# Patient Record
Sex: Female | Born: 1945 | Race: White | Hispanic: No | Marital: Married | Smoking: Never smoker
Health system: Southern US, Community
[De-identification: ages and names within clinical notes are randomized; demographics above are authoritative.]

## PROBLEM LIST (undated history)

## (undated) DIAGNOSIS — I82409 Acute embolism and thrombosis of unspecified deep veins of unspecified lower extremity: Secondary | ICD-10-CM

## (undated) DIAGNOSIS — K219 Gastro-esophageal reflux disease without esophagitis: Secondary | ICD-10-CM

## (undated) DIAGNOSIS — M545 Low back pain, unspecified: Secondary | ICD-10-CM

## (undated) DIAGNOSIS — M1990 Unspecified osteoarthritis, unspecified site: Secondary | ICD-10-CM

## (undated) DIAGNOSIS — R17 Unspecified jaundice: Secondary | ICD-10-CM

## (undated) DIAGNOSIS — R519 Headache, unspecified: Secondary | ICD-10-CM

## (undated) DIAGNOSIS — E785 Hyperlipidemia, unspecified: Secondary | ICD-10-CM

## (undated) DIAGNOSIS — I2510 Atherosclerotic heart disease of native coronary artery without angina pectoris: Secondary | ICD-10-CM

## (undated) DIAGNOSIS — K7460 Unspecified cirrhosis of liver: Secondary | ICD-10-CM

## (undated) DIAGNOSIS — G4730 Sleep apnea, unspecified: Secondary | ICD-10-CM

## (undated) DIAGNOSIS — N289 Disorder of kidney and ureter, unspecified: Secondary | ICD-10-CM

## (undated) DIAGNOSIS — K760 Fatty (change of) liver, not elsewhere classified: Secondary | ICD-10-CM

## (undated) DIAGNOSIS — I2089 Other forms of angina pectoris: Secondary | ICD-10-CM

## (undated) DIAGNOSIS — G8929 Other chronic pain: Secondary | ICD-10-CM

## (undated) DIAGNOSIS — D696 Thrombocytopenia, unspecified: Secondary | ICD-10-CM

## (undated) DIAGNOSIS — R51 Headache: Secondary | ICD-10-CM

## (undated) DIAGNOSIS — L97909 Non-pressure chronic ulcer of unspecified part of unspecified lower leg with unspecified severity: Secondary | ICD-10-CM

## (undated) DIAGNOSIS — M109 Gout, unspecified: Secondary | ICD-10-CM

## (undated) DIAGNOSIS — I10 Essential (primary) hypertension: Secondary | ICD-10-CM

## (undated) DIAGNOSIS — I4891 Unspecified atrial fibrillation: Secondary | ICD-10-CM

## (undated) DIAGNOSIS — E1142 Type 2 diabetes mellitus with diabetic polyneuropathy: Secondary | ICD-10-CM

## (undated) DIAGNOSIS — I208 Other forms of angina pectoris: Secondary | ICD-10-CM

## (undated) DIAGNOSIS — E119 Type 2 diabetes mellitus without complications: Secondary | ICD-10-CM

## (undated) DIAGNOSIS — N183 Chronic kidney disease, stage 3 (moderate): Secondary | ICD-10-CM

## (undated) DIAGNOSIS — I509 Heart failure, unspecified: Secondary | ICD-10-CM

## (undated) DIAGNOSIS — H409 Unspecified glaucoma: Secondary | ICD-10-CM

## (undated) DIAGNOSIS — Z9289 Personal history of other medical treatment: Secondary | ICD-10-CM

## (undated) DIAGNOSIS — M797 Fibromyalgia: Secondary | ICD-10-CM

## (undated) HISTORY — PX: DILATION AND CURETTAGE OF UTERUS: SHX78

## (undated) HISTORY — PX: EXCISIONAL HEMORRHOIDECTOMY: SHX1541

## (undated) HISTORY — PX: CORONARY ANGIOPLASTY WITH STENT PLACEMENT: SHX49

## (undated) HISTORY — PX: TONSILLECTOMY AND ADENOIDECTOMY: SUR1326

## (undated) HISTORY — PX: APPENDECTOMY: SHX54

## (undated) HISTORY — PX: CHOLECYSTECTOMY OPEN: SUR202

## (undated) HISTORY — DX: Essential (primary) hypertension: I10

## (undated) HISTORY — PX: GANGLION CYST EXCISION: SHX1691

## (undated) HISTORY — PX: ANAL FISSURE REPAIR: SHX2312

## (undated) HISTORY — PX: TOE AMPUTATION: SHX809

## (undated) HISTORY — PX: PILONIDAL CYST EXCISION: SHX744

## (undated) HISTORY — PX: BUNIONECTOMY WITH HAMMERTOE RECONSTRUCTION: SHX5600

## (undated) HISTORY — PX: WISDOM TOOTH EXTRACTION: SHX21

---

## 1978-03-22 HISTORY — PX: ABDOMINAL HYSTERECTOMY: SHX81

## 1978-11-21 HISTORY — PX: NECK LESION BIOPSY: SHX2078

## 1998-02-10 ENCOUNTER — Encounter: Payer: Self-pay | Admitting: Endocrinology

## 1998-02-10 ENCOUNTER — Ambulatory Visit (HOSPITAL_COMMUNITY): Admission: RE | Admit: 1998-02-10 | Discharge: 1998-02-10 | Payer: Self-pay | Admitting: Endocrinology

## 1998-12-05 ENCOUNTER — Observation Stay (HOSPITAL_COMMUNITY): Admission: RE | Admit: 1998-12-05 | Discharge: 1998-12-06 | Payer: Self-pay | Admitting: *Deleted

## 1999-01-12 ENCOUNTER — Encounter: Payer: Self-pay | Admitting: Emergency Medicine

## 1999-01-12 ENCOUNTER — Ambulatory Visit (HOSPITAL_COMMUNITY): Admission: RE | Admit: 1999-01-12 | Discharge: 1999-01-12 | Payer: Self-pay | Admitting: Emergency Medicine

## 1999-01-12 ENCOUNTER — Emergency Department (HOSPITAL_COMMUNITY): Admission: EM | Admit: 1999-01-12 | Discharge: 1999-01-12 | Payer: Self-pay | Admitting: Emergency Medicine

## 1999-05-04 ENCOUNTER — Ambulatory Visit (HOSPITAL_COMMUNITY): Admission: RE | Admit: 1999-05-04 | Discharge: 1999-05-04 | Payer: Self-pay | Admitting: Endocrinology

## 1999-05-04 ENCOUNTER — Encounter: Payer: Self-pay | Admitting: Endocrinology

## 2000-01-11 ENCOUNTER — Ambulatory Visit (HOSPITAL_COMMUNITY): Admission: RE | Admit: 2000-01-11 | Discharge: 2000-01-11 | Payer: Self-pay | Admitting: *Deleted

## 2000-01-11 ENCOUNTER — Encounter (INDEPENDENT_AMBULATORY_CARE_PROVIDER_SITE_OTHER): Payer: Self-pay

## 2000-01-14 ENCOUNTER — Ambulatory Visit (HOSPITAL_COMMUNITY): Admission: RE | Admit: 2000-01-14 | Discharge: 2000-01-14 | Payer: Self-pay | Admitting: *Deleted

## 2000-01-14 ENCOUNTER — Encounter: Payer: Self-pay | Admitting: *Deleted

## 2000-07-06 ENCOUNTER — Ambulatory Visit (HOSPITAL_COMMUNITY): Admission: RE | Admit: 2000-07-06 | Discharge: 2000-07-06 | Payer: Self-pay | Admitting: Endocrinology

## 2000-07-06 ENCOUNTER — Encounter: Payer: Self-pay | Admitting: Endocrinology

## 2000-07-14 ENCOUNTER — Other Ambulatory Visit: Admission: RE | Admit: 2000-07-14 | Discharge: 2000-07-14 | Payer: Self-pay | Admitting: Endocrinology

## 2001-02-01 ENCOUNTER — Encounter: Admission: RE | Admit: 2001-02-01 | Discharge: 2001-02-01 | Payer: Self-pay | Admitting: Endocrinology

## 2001-02-01 ENCOUNTER — Encounter: Payer: Self-pay | Admitting: Endocrinology

## 2001-06-08 ENCOUNTER — Encounter: Payer: Self-pay | Admitting: Endocrinology

## 2001-06-08 ENCOUNTER — Ambulatory Visit (HOSPITAL_COMMUNITY): Admission: RE | Admit: 2001-06-08 | Discharge: 2001-06-08 | Payer: Self-pay | Admitting: Endocrinology

## 2002-04-02 ENCOUNTER — Inpatient Hospital Stay (HOSPITAL_COMMUNITY): Admission: EM | Admit: 2002-04-02 | Discharge: 2002-04-06 | Payer: Self-pay | Admitting: Emergency Medicine

## 2002-04-02 ENCOUNTER — Encounter: Payer: Self-pay | Admitting: *Deleted

## 2002-04-04 ENCOUNTER — Encounter: Payer: Self-pay | Admitting: *Deleted

## 2002-06-05 ENCOUNTER — Ambulatory Visit (HOSPITAL_COMMUNITY): Admission: RE | Admit: 2002-06-05 | Discharge: 2002-06-05 | Payer: Self-pay | Admitting: Endocrinology

## 2002-06-05 ENCOUNTER — Encounter: Payer: Self-pay | Admitting: Endocrinology

## 2002-08-07 ENCOUNTER — Ambulatory Visit (HOSPITAL_COMMUNITY): Admission: RE | Admit: 2002-08-07 | Discharge: 2002-08-08 | Payer: Self-pay | Admitting: *Deleted

## 2002-08-07 ENCOUNTER — Encounter: Payer: Self-pay | Admitting: *Deleted

## 2002-10-03 ENCOUNTER — Ambulatory Visit (HOSPITAL_COMMUNITY): Admission: RE | Admit: 2002-10-03 | Discharge: 2002-10-03 | Payer: Self-pay | Admitting: *Deleted

## 2002-10-03 ENCOUNTER — Encounter (INDEPENDENT_AMBULATORY_CARE_PROVIDER_SITE_OTHER): Payer: Self-pay

## 2002-12-23 ENCOUNTER — Encounter: Payer: Self-pay | Admitting: *Deleted

## 2002-12-23 ENCOUNTER — Inpatient Hospital Stay (HOSPITAL_COMMUNITY): Admission: EM | Admit: 2002-12-23 | Discharge: 2002-12-26 | Payer: Self-pay | Admitting: Emergency Medicine

## 2003-03-20 ENCOUNTER — Inpatient Hospital Stay (HOSPITAL_COMMUNITY): Admission: EM | Admit: 2003-03-20 | Discharge: 2003-03-22 | Payer: Self-pay | Admitting: Emergency Medicine

## 2003-03-23 DIAGNOSIS — Z9289 Personal history of other medical treatment: Secondary | ICD-10-CM

## 2003-03-23 HISTORY — PX: CORONARY ARTERY BYPASS GRAFT: SHX141

## 2003-03-23 HISTORY — DX: Personal history of other medical treatment: Z92.89

## 2003-06-29 ENCOUNTER — Inpatient Hospital Stay (HOSPITAL_COMMUNITY): Admission: EM | Admit: 2003-06-29 | Discharge: 2003-07-15 | Payer: Self-pay | Admitting: Emergency Medicine

## 2003-08-05 ENCOUNTER — Encounter
Admission: RE | Admit: 2003-08-05 | Discharge: 2003-08-05 | Payer: Self-pay | Admitting: Thoracic Surgery (Cardiothoracic Vascular Surgery)

## 2006-03-17 ENCOUNTER — Encounter: Admission: RE | Admit: 2006-03-17 | Discharge: 2006-03-17 | Payer: Self-pay | Admitting: Endocrinology

## 2006-05-08 ENCOUNTER — Emergency Department (HOSPITAL_COMMUNITY): Admission: EM | Admit: 2006-05-08 | Discharge: 2006-05-08 | Payer: Self-pay | Admitting: Emergency Medicine

## 2006-06-02 ENCOUNTER — Encounter: Admission: RE | Admit: 2006-06-02 | Discharge: 2006-06-02 | Payer: Self-pay | Admitting: *Deleted

## 2006-06-08 ENCOUNTER — Ambulatory Visit (HOSPITAL_COMMUNITY): Admission: RE | Admit: 2006-06-08 | Discharge: 2006-06-09 | Payer: Self-pay | Admitting: *Deleted

## 2006-06-30 ENCOUNTER — Ambulatory Visit: Payer: Self-pay | Admitting: Vascular Surgery

## 2006-06-30 ENCOUNTER — Inpatient Hospital Stay (HOSPITAL_COMMUNITY): Admission: EM | Admit: 2006-06-30 | Discharge: 2006-07-04 | Payer: Self-pay | Admitting: Emergency Medicine

## 2006-07-01 ENCOUNTER — Encounter (INDEPENDENT_AMBULATORY_CARE_PROVIDER_SITE_OTHER): Payer: Self-pay | Admitting: Specialist

## 2006-07-28 ENCOUNTER — Ambulatory Visit: Payer: Self-pay | Admitting: *Deleted

## 2006-08-05 ENCOUNTER — Emergency Department (HOSPITAL_COMMUNITY): Admission: EM | Admit: 2006-08-05 | Discharge: 2006-08-05 | Payer: Self-pay | Admitting: Emergency Medicine

## 2006-08-08 ENCOUNTER — Ambulatory Visit: Payer: Self-pay | Admitting: Vascular Surgery

## 2006-08-18 ENCOUNTER — Ambulatory Visit: Payer: Self-pay | Admitting: *Deleted

## 2006-09-01 ENCOUNTER — Ambulatory Visit: Payer: Self-pay | Admitting: *Deleted

## 2006-10-13 ENCOUNTER — Encounter: Admission: RE | Admit: 2006-10-13 | Discharge: 2006-10-13 | Payer: Self-pay | Admitting: *Deleted

## 2006-10-13 ENCOUNTER — Ambulatory Visit: Payer: Self-pay | Admitting: *Deleted

## 2007-01-05 ENCOUNTER — Ambulatory Visit: Payer: Self-pay | Admitting: *Deleted

## 2007-05-16 ENCOUNTER — Encounter (INDEPENDENT_AMBULATORY_CARE_PROVIDER_SITE_OTHER): Payer: Self-pay | Admitting: *Deleted

## 2007-05-16 ENCOUNTER — Ambulatory Visit (HOSPITAL_COMMUNITY): Admission: RE | Admit: 2007-05-16 | Discharge: 2007-05-16 | Payer: Self-pay | Admitting: *Deleted

## 2009-05-02 ENCOUNTER — Inpatient Hospital Stay (HOSPITAL_COMMUNITY): Admission: EM | Admit: 2009-05-02 | Discharge: 2009-05-08 | Payer: Self-pay | Admitting: Emergency Medicine

## 2009-05-07 ENCOUNTER — Ambulatory Visit: Payer: Self-pay | Admitting: Infectious Diseases

## 2009-05-08 ENCOUNTER — Ambulatory Visit: Payer: Self-pay | Admitting: Infectious Diseases

## 2009-05-12 ENCOUNTER — Observation Stay (HOSPITAL_COMMUNITY): Admission: EM | Admit: 2009-05-12 | Discharge: 2009-05-15 | Payer: Self-pay | Admitting: Emergency Medicine

## 2009-05-12 ENCOUNTER — Encounter (INDEPENDENT_AMBULATORY_CARE_PROVIDER_SITE_OTHER): Payer: Self-pay | Admitting: Emergency Medicine

## 2009-05-12 ENCOUNTER — Ambulatory Visit: Payer: Self-pay | Admitting: Surgery

## 2009-05-12 ENCOUNTER — Encounter: Payer: Self-pay | Admitting: Infectious Disease

## 2009-05-12 DIAGNOSIS — N183 Chronic kidney disease, stage 3 unspecified: Secondary | ICD-10-CM | POA: Insufficient documentation

## 2009-05-12 DIAGNOSIS — L97909 Non-pressure chronic ulcer of unspecified part of unspecified lower leg with unspecified severity: Secondary | ICD-10-CM | POA: Insufficient documentation

## 2009-05-12 DIAGNOSIS — I5031 Acute diastolic (congestive) heart failure: Secondary | ICD-10-CM

## 2009-05-12 DIAGNOSIS — E119 Type 2 diabetes mellitus without complications: Secondary | ICD-10-CM

## 2009-05-12 HISTORY — DX: Chronic kidney disease, stage 3 unspecified: N18.30

## 2009-05-12 HISTORY — DX: Non-pressure chronic ulcer of unspecified part of unspecified lower leg with unspecified severity: L97.909

## 2009-05-14 ENCOUNTER — Encounter (INDEPENDENT_AMBULATORY_CARE_PROVIDER_SITE_OTHER): Payer: Self-pay | Admitting: Internal Medicine

## 2009-05-21 ENCOUNTER — Encounter: Payer: Self-pay | Admitting: Infectious Diseases

## 2009-05-26 ENCOUNTER — Encounter (INDEPENDENT_AMBULATORY_CARE_PROVIDER_SITE_OTHER): Payer: Self-pay | Admitting: *Deleted

## 2009-06-03 ENCOUNTER — Encounter: Payer: Self-pay | Admitting: Infectious Diseases

## 2009-06-03 ENCOUNTER — Telehealth: Payer: Self-pay | Admitting: Infectious Diseases

## 2009-06-05 ENCOUNTER — Ambulatory Visit: Payer: Self-pay | Admitting: Infectious Diseases

## 2009-06-05 DIAGNOSIS — M86179 Other acute osteomyelitis, unspecified ankle and foot: Secondary | ICD-10-CM | POA: Insufficient documentation

## 2009-06-06 ENCOUNTER — Encounter: Payer: Self-pay | Admitting: Infectious Diseases

## 2009-06-09 ENCOUNTER — Telehealth: Payer: Self-pay | Admitting: Infectious Diseases

## 2009-06-11 ENCOUNTER — Encounter: Payer: Self-pay | Admitting: Infectious Diseases

## 2009-06-15 ENCOUNTER — Emergency Department (HOSPITAL_COMMUNITY): Admission: EM | Admit: 2009-06-15 | Discharge: 2009-06-15 | Payer: Self-pay | Admitting: Emergency Medicine

## 2009-07-07 ENCOUNTER — Encounter: Payer: Self-pay | Admitting: Infectious Diseases

## 2009-07-09 ENCOUNTER — Encounter: Payer: Self-pay | Admitting: Infectious Diseases

## 2009-10-03 ENCOUNTER — Inpatient Hospital Stay (HOSPITAL_COMMUNITY): Admission: EM | Admit: 2009-10-03 | Discharge: 2009-10-07 | Payer: Self-pay | Admitting: Emergency Medicine

## 2009-10-05 ENCOUNTER — Encounter (INDEPENDENT_AMBULATORY_CARE_PROVIDER_SITE_OTHER): Payer: Self-pay | Admitting: Internal Medicine

## 2009-12-09 ENCOUNTER — Encounter
Admission: RE | Admit: 2009-12-09 | Discharge: 2010-03-09 | Payer: Self-pay | Source: Home / Self Care | Attending: Endocrinology | Admitting: Endocrinology

## 2010-03-30 ENCOUNTER — Encounter: Admit: 2010-03-30 | Payer: Self-pay | Admitting: Endocrinology

## 2010-04-23 NOTE — Letter (Signed)
Summary: [facility]  [facility]   Imported By: Margie Billet 06/06/2009 16:28:34  _____________________________________________________________________  External Attachment:    Type:   Image     Comment:   External Document

## 2010-04-23 NOTE — Progress Notes (Signed)
Summary: plan care oversight  Phone Note Other Incoming   Summary of Call: [phone] mins) [phone] (30 or more mins) [phone](> 60 mins) I have supervised home care and/or infusion therapy for this pt, including providing orders for care, review of labs and/or home health care plans, communicating with the home health care professionals and/or patient/caregivers to integrate current information into the medical treatment plan and/or adjust the medical therapy. This supervision has been provided for 31 minutes during the calendar month. Dates for this oversight 05/07/09-06/08/09   Initial call taken by: Clydie Braun MD,  June 03, 2009 2:26 PM

## 2010-04-23 NOTE — Miscellaneous (Signed)
Summary: [facility]: Verbal Orders  [facility]: Verbal Orders   Imported By: Florinda Marker 07/03/2009 11:20:49  _____________________________________________________________________  External Attachment:    Type:   Image     Comment:   External Document

## 2010-04-23 NOTE — Miscellaneous (Signed)
Summary: [facility]:Home Health Cert. & Plan Of Care  [facility]:Home Health Cert. & Plan Of Care   Imported By: Florinda Marker 07/07/2009 14:58:03  _____________________________________________________________________  External Attachment:    Type:   Image     Comment:   External Document

## 2010-04-23 NOTE — Progress Notes (Signed)
Summary: picc line/TY  Phone Note Call from Patient   Caller: Patient Call For: Clydie Braun MD Summary of Call: Patient called stating that she was told to come back to the hospital to have the picc line removed because of possibility of excessive bleeding. I explained that her home health nurse could pull it for her, the patient was nervous about having it pulled at home and she said she would come to the ED on Sunday afternoon to have it pulled by the IV team. I explained to her that she may have to wait for a few hours depending on the amount of people at the ED.  Initial call taken by: Starleen Arms CMA,  June 10, 2009 9:14 AM

## 2010-04-23 NOTE — Miscellaneous (Signed)
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) - mulit-drug resistrent, staph coag - Signed Added new problem of DM (ICD-250.00) - uncontrolled - Signed Added new problem of CHRONIC KIDNEY DISEASE STAGE III (MODERATE) (ICD-585.3) - Signed Medications: Added new medication of VANCOCIN HCL 250 MG CAPS (VANCOMYCIN HCL) Take 4 capsules by mouth two times a day - Signed Added new medication of BUMETANIDE 1 MG TABS (BUMETANIDE) Take 1 tablet by mouth once a day - Signed Added new medication of ALPHAGAN P 0.15 % SOLN (BRIMONIDINE TARTRATE) Apply one drop each eye three times a day - Signed Added new medication of ASPIRIN 325 MG TABS (ASPIRIN) Take 1 tablet by mouth once a day - Signed Added new medication of BENAZEPRIL HCL 20 MG TABS (BENAZEPRIL HCL) Take 1 tablet by mouth once a day - Signed Added new medication of BYETTA 5 MCG PEN 5 MCG/0.02ML SOLN (EXENATIDE) Take 1 tablet by mouth three times a day - Signed Added new medication of COLACE 100 MG CAPS (DOCUSATE SODIUM) Take 1 capsule by mouth three times a day - Signed Added new medication of CRESTOR 20 MG TABS (ROSUVASTATIN CALCIUM) Take 1 tablet by mouth at bedtime - Signed Added new medication of GAS-X EXTRA STRENGTH 125 MG CHEW (SIMETHICONE) Chew 1 tablet by mouth four times a day - Signed Added new medication of HUMALOG 100 UNIT/ML SOLN (INSULIN LISPRO (HUMAN)) Inject 14-30 units subcutaneously three times a day sliding scale - Signed Added new medication of ISOSORBIDE MONONITRATE 20 MG TABS (ISOSORBIDE MONONITRATE) Take 1 tablet by mouth once a day - Signed Added new medication of LEVEMIR 100 UNIT/ML SOLN (INSULIN DETEMIR) Administer 100 units subcutaneously two times a day - Signed Added new medication of LOVAZA 1 GM CAPS (OMEGA-3-ACID ETHYL ESTERS) Take 1 capsule  by mouth four times a day - Signed Added new medication of LUMIGAN 0.03 % SOLN (BIMATOPROST) Administer 1 drop in each eye at bedtime - Signed Added new medication of NASONEX 50 MCG/ACT SUSP (MOMETASONE FUROATE) Administer 1 spray in each nostril two times a day - Signed Added new medication of NEXIUM 40 MG PACK (ESOMEPRAZOLE MAGNESIUM) Take 1 capsule by mouth once a day - Signed Added new medication of PLAVIX 75 MG TABS (CLOPIDOGREL BISULFATE) Take 1 tablet by mouth once a day Added new medication of SOTALOL HCL (AF) 80 MG TABS (SOTALOL HCL AF) Take 1 tablet by mouth two times a day Added new medication of TYLENOL EXTRA STRENGTH 500 MG TABS (ACETAMINOPHEN) Take 4 tablet by mouth two times a day per discharge summary Added new medication of TRILIPIX 135 MG CPDR (CHOLINE FENOFIBRATE) Take 1 tablet by mouth once a day Changed medication from LEVEMIR 100 UNIT/ML SOLN (INSULIN DETEMIR) Administer 100 units subcutaneously two times a day to LEVEMIR 100 UNIT/ML SOLN (INSULIN DETEMIR) Administer 90 units subcutaneously two times a day Added new medication of BETAPACE 80 MG TABS (SOTALOL HCL) Take 1 tablet by mouth two times a day Changed medication from ASPIRIN 325 MG TABS (ASPIRIN) Take 1 tablet by mouth once a day to ASPIRIN 81 MG TABS (ASPIRIN) Take 1 tablet by mouth once a day Allergies: Added new allergy or adverse reaction of PCN Added new allergy or adverse reaction of CODEINE Added new allergy or adverse reaction of SULFA Added new allergy or adverse reaction of ASA Added new allergy or adverse  reaction of ZOCOR Added new allergy or adverse reaction of LESCOL Observations: Added new observation of ALLERGY REV: Done (05/26/2009 16:11) Added new observation of NKA: F (05/26/2009 16:11)

## 2010-04-23 NOTE — Miscellaneous (Signed)
Summary: [facility]: Revision To Plan Of Care  [facility]: Revision To Plan Of Care   Imported By: Florinda Marker 07/10/2009 16:42:49  _____________________________________________________________________  External Attachment:    Type:   Image     Comment:   External Document

## 2010-04-23 NOTE — Assessment & Plan Note (Signed)
Summary: [facility] resfrom 3/15/kam need chart/   Visit Type:  Follow-up Primary Provider:  Clydie Braun MD  CC:  [facility] .  History of Present Illness: 65 yo with mmp who I saw in Feb for L 2nd toe osteomyelitis.  She had surg feb 12 by Dr Lestine Box -  Right second toe amputation through metatarsophalangeal  joint.  Cx with Coag neg staph. D.ced on iv vanco Had a readmit for chf soon after that but now is doing better.  Saw Dr Lestine Box on March 4th and stitches removed.  Sees him again april 4th. Has been getting [facility] - last labs done monday.   no rashes fevers chills.  No NS.    Preventive Screening-Counseling & Management  Alcohol-Tobacco     Alcohol drinks/day: 0     Smoking Status: never  Caffeine-Diet-Exercise     Caffeine use/day: 0     Does Patient Exercise: no  Safety-Violence-Falls     Seat Belt Use: yes   Updated Prior Medication List: VANCOCIN HCL 250 MG CAPS (VANCOMYCIN HCL) Take 4 capsules by mouth two times a day BUMETANIDE 1 MG TABS (BUMETANIDE) Take 1 tablet by mouth once a day ALPHAGAN P 0.15 % SOLN (BRIMONIDINE TARTRATE) Apply one drop each eye three times a day ASPIRIN 81 MG TABS (ASPIRIN) Take 1 tablet by mouth once a day BENAZEPRIL HCL 20 MG TABS (BENAZEPRIL HCL) Take 1 tablet by mouth once a day BYETTA 5 MCG PEN 5 MCG/0.02ML SOLN (EXENATIDE) Take 1 tablet by mouth three times a day COLACE 100 MG CAPS (DOCUSATE SODIUM) Take 1 capsule by mouth three times a day CRESTOR 20 MG TABS (ROSUVASTATIN CALCIUM) Take 1 tablet by mouth at bedtime GAS-X EXTRA STRENGTH 125 MG CHEW (SIMETHICONE) Chew 1 tablet by mouth four times a day HUMALOG 100 UNIT/ML SOLN (INSULIN LISPRO (HUMAN)) Inject 14-30 units subcutaneously three times a day sliding scale ISOSORBIDE MONONITRATE 20 MG TABS (ISOSORBIDE MONONITRATE) Take 1 tablet by mouth once a day LEVEMIR 100 UNIT/ML SOLN (INSULIN DETEMIR) Administer 90 units subcutaneously two times a day LOVAZA 1 GM CAPS  (OMEGA-3-ACID ETHYL ESTERS) Take 1 capsule by mouth four times a day LUMIGAN 0.03 % SOLN (BIMATOPROST) Administer 1 drop in each eye at bedtime NASONEX 50 MCG/ACT SUSP (MOMETASONE FUROATE) Administer 1 spray in each nostril two times a day NEXIUM 40 MG PACK (ESOMEPRAZOLE MAGNESIUM) Take 1 capsule by mouth once a day PLAVIX 75 MG TABS (CLOPIDOGREL BISULFATE) Take 1 tablet by mouth once a day SOTALOL HCL (AF) 80 MG TABS (SOTALOL HCL AF) Take 1 tablet by mouth two times a day TYLENOL EXTRA STRENGTH 500 MG TABS (ACETAMINOPHEN) Take 4 tablet by mouth two times a day per discharge summary TRILIPIX 135 MG CPDR (CHOLINE FENOFIBRATE) Take 1 tablet by mouth once a day BETAPACE 80 MG TABS (SOTALOL HCL) Take 1 tablet by mouth two times a day  Current Allergies (reviewed today): ! PCN ! CODEINE ! SULFA ! ASA ! ZOCOR ! LESCOL ! CIPRO Past History:  Family History: Last updated: 06/05/2009 no contributory  Social History: Last updated: 06/05/2009 lives with husband. no tobacco  Risk Factors: Alcohol Use: 0 (06/05/2009) Caffeine Use: 0 (06/05/2009) Exercise: no (06/05/2009)  Risk Factors: Smoking Status: never (06/05/2009)  Past Medical History: dm htn chf  Family History: no contributory  Social History: lives with husband. no tobacco  Review of Systems       11 systems reviewed and negative except per HPI   Vital  Signs:  Patient profile:   65 year old female Temp:     96.8 degrees F (36 degrees C) oral Pulse rate:   44 / minute BP sitting:   99 / 52  (left arm)  Vitals Entered By: Baxter Hire) (June 05, 2009 11:11 AM) CC: [facility]  Pain Assessment Patient in pain? no      Nutritional Status Detail appetite is okay per patient  Does patient need assistance? Functional Status Self care Ambulation Normal Comments patient uses a walker and cane per patient at home   Physical Exam  General:  alert and well-developed.   Head:  normocephalic.   Eyes:   vision grossly intact and pupils equal.   Mouth:  fair dentition.   Lungs:  normal respiratory effort and normal breath sounds.   Heart:  brady but reg Abdomen:  soft, normal bowel sounds, and no distention.   Msk:  l great toe missing, l 2nd toe site healed with no redness tenderness or drainage Extremities:  no cce Neurologic:  alert & oriented X3 and cranial nerves II-XII intact.   Skin:  RUE picc site wnl Cervical Nodes:  no anterior cervical adenopathy and no posterior cervical adenopathy.   Psych:  Oriented X3, memory intact for recent and remote, and normally interactive.   Additional Exam:  Cx   ORGANISM:                     ABUNDANT                                STAPHYLOCOCCUS SPECIES(COAGULASE NEGATIVE)  METHOD:                       MIC  CLINDAMYCIN:                  RESISTANT  ERYTHROMYCIN:                 >=8                                RESISTANT  GENTAMICIN:                   <=0.5                                SENSITIVE  LEVOFLOXACIN:                 <=0.12                                SENSITIVE  OXACILLIN:                    2                                RESISTANT  PENICILLIN:                   >=0.5                                RESISTANT  RIFAMPIN:                     <=  0.5                                SENSITIVE  VANCOMYCIN:                   <=0.5                                SENSITIVE  TETRACYCLINE:                 >=16                                RESISTANT   Impression & Recommendations:  Problem # 1:  ACUTE OSTEOMYELITIS, ANKLE AND FOOT (ICD-730.07) 65 yo with Co neg staph osteo of L 2nd toe - s/p amputation of toe 2/12 and on vanco since then.  Doing well - wound almost healed.  Baseline Esr was only 30 in Feb so will not repear. At this point will finish off vanco for another 10 days and then stop and pull picc line.  No great oral alternative for this based on her sensitivities (except possibly levofloxacin - but she has not tolerated  cipro in past).  Her CoNS was resistant to clinda, bactrim and doxy. f.u one month to ensure healing  Her updated medication list for this problem includes:    Vancocin Hcl 250 Mg Caps (Vancomycin hcl) .Marland Kitchen... Take 4 capsules by mouth two times a day    Aspirin 81 Mg Tabs (Aspirin) .Marland Kitchen... Take 1 tablet by mouth once a day    Tylenol Extra Strength 500 Mg Tabs (Acetaminophen) .Marland Kitchen... Take 4 tablet by mouth two times a day per discharge summary  Problem # 2:  DM (ICD-250.00) Following withDr Juleen China  tomorrow Her updated medication list for this problem includes:    Aspirin 81 Mg Tabs (Aspirin) .Marland Kitchen... Take 1 tablet by mouth once a day    Benazepril Hcl 20 Mg Tabs (Benazepril hcl) .Marland Kitchen... Take 1 tablet by mouth once a day    Byetta 5 Mcg Pen 5 Mcg/0.58ml Soln (Exenatide) .Marland Kitchen... Take 1 tablet by mouth three times a day    Humalog 100 Unit/ml Soln (Insulin lispro (human)) ..... Inject 14-30 units subcutaneously three times a day sliding scale    Levemir 100 Unit/ml Soln (Insulin detemir) .Marland Kitchen... Administer 90 units subcutaneously two times a day  Problem # 3:  ACUTE DIASTOLIC HEART FAILURE (ICD-428.31) Dr Alanda Amass follows her for this.  SHe has some dizzyness and weakness and her bP is a little low.  Likely overmedicated.  SHe will go to talk with Dr Mancel Parsons nurse today. Her updated medication list for this problem includes:    Bumetanide 1 Mg Tabs (Bumetanide) .Marland Kitchen... Take 1 tablet by mouth once a day    Aspirin 81 Mg Tabs (Aspirin) .Marland Kitchen... Take 1 tablet by mouth once a day    Benazepril Hcl 20 Mg Tabs (Benazepril hcl) .Marland Kitchen... Take 1 tablet by mouth once a day    Plavix 75 Mg Tabs (Clopidogrel bisulfate) .Marland Kitchen... Take 1 tablet by mouth once a day    Sotalol Hcl (af) 80 Mg Tabs (Sotalol hcl af) .Marland Kitchen... Take 1 tablet by mouth two times a day    Betapace 80 Mg Tabs (Sotalol hcl) .Marland Kitchen... Take 1 tablet by mouth two times a  day  Patient Instructions: 1)  Continue 10 more days of vanco then stop and pull picc line. 2)   Follow up in one month.

## 2010-04-23 NOTE — Miscellaneous (Signed)
Summary: [facility]: Orders  [facility]: Orders   Imported By: Florinda Marker 07/10/2009 16:43:49  _____________________________________________________________________  External Attachment:    Type:   Image     Comment:   External Document

## 2010-04-23 NOTE — Consult Note (Signed)
Summary: [facility]   Imported By: Florinda Marker 08/26/2009 16:10:18  _____________________________________________________________________  External Attachment:    Type:   Image     Comment:   External Document

## 2010-04-23 NOTE — Miscellaneous (Signed)
Summary: [facility]  [facility]   Imported By: Margie Billet 06/06/2009 16:26:15  _____________________________________________________________________  External Attachment:    Type:   Image     Comment:   External Document

## 2010-06-06 LAB — BASIC METABOLIC PANEL
BUN: 20 mg/dL (ref 6–23)
BUN: 23 mg/dL (ref 6–23)
BUN: 31 mg/dL — ABNORMAL HIGH (ref 6–23)
Calcium: 9.2 mg/dL (ref 8.4–10.5)
Calcium: 9.5 mg/dL (ref 8.4–10.5)
Chloride: 104 mEq/L (ref 96–112)
Chloride: 105 mEq/L (ref 96–112)
Creatinine, Ser: 1.2 mg/dL (ref 0.4–1.2)
Creatinine, Ser: 1.28 mg/dL — ABNORMAL HIGH (ref 0.4–1.2)
Creatinine, Ser: 1.41 mg/dL — ABNORMAL HIGH (ref 0.4–1.2)
GFR calc Af Amer: 51 mL/min — ABNORMAL LOW (ref >60)
GFR calc Af Amer: 55 mL/min — ABNORMAL LOW (ref >60)
GFR calc non Af Amer: 38 mL/min — ABNORMAL LOW (ref >60)
GFR calc non Af Amer: 42 mL/min — ABNORMAL LOW (ref >60)
GFR calc non Af Amer: 45 mL/min — ABNORMAL LOW (ref >60)
Glucose, Bld: 289 mg/dL — ABNORMAL HIGH (ref 70–99)

## 2010-06-06 LAB — PROTIME-INR
INR: 0.99 (ref 0.00–1.49)
Prothrombin Time: 13 seconds (ref 11.6–15.2)

## 2010-06-06 LAB — TSH: TSH: 2.697 u[IU]/mL (ref 0.350–4.500)

## 2010-06-06 LAB — APTT: aPTT: 76 seconds — ABNORMAL HIGH (ref 24–37)

## 2010-06-06 LAB — GLUCOSE, CAPILLARY
Glucose-Capillary: 175 mg/dL — ABNORMAL HIGH (ref 70–99)
Glucose-Capillary: 190 mg/dL — ABNORMAL HIGH (ref 70–99)
Glucose-Capillary: 192 mg/dL — ABNORMAL HIGH (ref 70–99)
Glucose-Capillary: 195 mg/dL — ABNORMAL HIGH (ref 70–99)
Glucose-Capillary: 198 mg/dL — ABNORMAL HIGH (ref 70–99)
Glucose-Capillary: 213 mg/dL — ABNORMAL HIGH (ref 70–99)
Glucose-Capillary: 221 mg/dL — ABNORMAL HIGH (ref 70–99)
Glucose-Capillary: 232 mg/dL — ABNORMAL HIGH (ref 70–99)
Glucose-Capillary: 241 mg/dL — ABNORMAL HIGH (ref 70–99)
Glucose-Capillary: 254 mg/dL — ABNORMAL HIGH (ref 70–99)
Glucose-Capillary: 292 mg/dL — ABNORMAL HIGH (ref 70–99)
Glucose-Capillary: 292 mg/dL — ABNORMAL HIGH (ref 70–99)
Glucose-Capillary: 324 mg/dL — ABNORMAL HIGH (ref 70–99)
Glucose-Capillary: 389 mg/dL — ABNORMAL HIGH (ref 70–99)

## 2010-06-06 LAB — CK TOTAL AND CKMB (NOT AT ARMC): Relative Index: 2.6 — ABNORMAL HIGH (ref 0.0–2.5)

## 2010-06-06 LAB — COMPREHENSIVE METABOLIC PANEL
ALT: 39 U/L — ABNORMAL HIGH (ref 0–35)
AST: 48 U/L — ABNORMAL HIGH (ref 0–37)
Albumin: 3.6 g/dL (ref 3.5–5.2)
Alkaline Phosphatase: 69 U/L (ref 39–117)
BUN: 39 mg/dL — ABNORMAL HIGH (ref 6–23)
CO2: 28 mEq/L (ref 19–32)
CO2: 29 mEq/L (ref 19–32)
Calcium: 9.4 mg/dL (ref 8.4–10.5)
Chloride: 97 mEq/L (ref 96–112)
Creatinine, Ser: 1.66 mg/dL — ABNORMAL HIGH (ref 0.4–1.2)
GFR calc Af Amer: 38 mL/min — ABNORMAL LOW (ref >60)
GFR calc non Af Amer: 31 mL/min — ABNORMAL LOW (ref >60)
GFR calc non Af Amer: 33 mL/min — ABNORMAL LOW (ref >60)
Glucose, Bld: 188 mg/dL — ABNORMAL HIGH (ref 70–99)
Potassium: 3.5 mEq/L (ref 3.5–5.1)
Sodium: 137 mEq/L (ref 135–145)
Total Bilirubin: 0.5 mg/dL (ref 0.3–1.2)
Total Protein: 6.4 g/dL (ref 6.0–8.3)

## 2010-06-06 LAB — CBC
HCT: 31.5 % — ABNORMAL LOW (ref 36.0–46.0)
HCT: 34.8 % — ABNORMAL LOW (ref 36.0–46.0)
Hemoglobin: 10.5 g/dL — ABNORMAL LOW (ref 12.0–15.0)
Hemoglobin: 12.1 g/dL (ref 12.0–15.0)
MCH: 28.6 pg (ref 26.0–34.0)
MCH: 28.7 pg (ref 26.0–34.0)
MCH: 28.7 pg (ref 26.0–34.0)
MCHC: 33.5 g/dL (ref 30.0–36.0)
MCV: 85.4 fL (ref 78.0–100.0)
MCV: 85.8 fL (ref 78.0–100.0)
Platelets: 146 10*3/uL — ABNORMAL LOW (ref 150–400)
Platelets: 84 10*3/uL — ABNORMAL LOW (ref 150–400)
Platelets: 91 10*3/uL — ABNORMAL LOW (ref 150–400)
RBC: 3.54 MIL/uL — ABNORMAL LOW (ref 3.87–5.11)
RBC: 3.66 MIL/uL — ABNORMAL LOW (ref 3.87–5.11)
RBC: 4.06 MIL/uL (ref 3.87–5.11)
RBC: 4.23 MIL/uL (ref 3.87–5.11)
RDW: 17.2 % — ABNORMAL HIGH (ref 11.5–15.5)
WBC: 5 10*3/uL (ref 4.0–10.5)
WBC: 5.9 10*3/uL (ref 4.0–10.5)

## 2010-06-06 LAB — DIFFERENTIAL
Basophils Absolute: 0.1 10*3/uL (ref 0.0–0.1)
Basophils Relative: 1 % (ref 0–1)
Eosinophils Absolute: 0.2 10*3/uL (ref 0.0–0.7)
Eosinophils Relative: 3 % (ref 0–5)
Monocytes Absolute: 0.6 10*3/uL (ref 0.1–1.0)

## 2010-06-06 LAB — LIPID PANEL
Cholesterol: 122 mg/dL (ref 0–200)
LDL Cholesterol: 33 mg/dL (ref 0–99)
Total CHOL/HDL Ratio: 3.7 RATIO
Triglycerides: 282 mg/dL — ABNORMAL HIGH (ref <150)

## 2010-06-06 LAB — HEPARIN INDUCED THROMBOCYTOPENIA PNL
Heparin Induced Plt Ab: NEGATIVE
Heparin Induced Plt Ab: NEGATIVE
Patient O.D.: 0.171
UFH Low Dose 0.1 IU/mL: 0 % Release
UFH SRA Result: NEGATIVE

## 2010-06-06 LAB — HEMOGLOBIN A1C
Hgb A1c MFr Bld: 8 % — ABNORMAL HIGH (ref <5.7)
Mean Plasma Glucose: 183 mg/dL — ABNORMAL HIGH (ref <117)

## 2010-06-06 LAB — CARDIAC PANEL(CRET KIN+CKTOT+MB+TROPI)
CK, MB: 2 ng/mL (ref 0.3–4.0)
Total CK: 80 U/L (ref 7–177)
Troponin I: 0.01 ng/mL (ref 0.00–0.06)

## 2010-06-06 LAB — HEPARIN LEVEL (UNFRACTIONATED): Heparin Unfractionated: 0.21 IU/mL — ABNORMAL LOW (ref 0.30–0.70)

## 2010-06-06 LAB — D-DIMER, QUANTITATIVE: D-Dimer, Quant: 0.73 ug/mL-FEU — ABNORMAL HIGH (ref 0.00–0.48)

## 2010-06-06 LAB — MAGNESIUM: Magnesium: 2.2 mg/dL (ref 1.5–2.5)

## 2010-06-06 LAB — MRSA PCR SCREENING: MRSA by PCR: NEGATIVE

## 2010-06-06 LAB — TROPONIN I: Troponin I: 0.03 ng/mL (ref 0.00–0.06)

## 2010-06-06 LAB — POCT CARDIAC MARKERS

## 2010-06-10 LAB — GLUCOSE, CAPILLARY
Glucose-Capillary: 107 mg/dL — ABNORMAL HIGH (ref 70–99)
Glucose-Capillary: 117 mg/dL — ABNORMAL HIGH (ref 70–99)
Glucose-Capillary: 130 mg/dL — ABNORMAL HIGH (ref 70–99)
Glucose-Capillary: 150 mg/dL — ABNORMAL HIGH (ref 70–99)
Glucose-Capillary: 154 mg/dL — ABNORMAL HIGH (ref 70–99)
Glucose-Capillary: 155 mg/dL — ABNORMAL HIGH (ref 70–99)
Glucose-Capillary: 157 mg/dL — ABNORMAL HIGH (ref 70–99)
Glucose-Capillary: 169 mg/dL — ABNORMAL HIGH (ref 70–99)
Glucose-Capillary: 180 mg/dL — ABNORMAL HIGH (ref 70–99)
Glucose-Capillary: 187 mg/dL — ABNORMAL HIGH (ref 70–99)
Glucose-Capillary: 188 mg/dL — ABNORMAL HIGH (ref 70–99)
Glucose-Capillary: 196 mg/dL — ABNORMAL HIGH (ref 70–99)
Glucose-Capillary: 196 mg/dL — ABNORMAL HIGH (ref 70–99)
Glucose-Capillary: 207 mg/dL — ABNORMAL HIGH (ref 70–99)
Glucose-Capillary: 227 mg/dL — ABNORMAL HIGH (ref 70–99)
Glucose-Capillary: 282 mg/dL — ABNORMAL HIGH (ref 70–99)
Glucose-Capillary: 359 mg/dL — ABNORMAL HIGH (ref 70–99)
Glucose-Capillary: 73 mg/dL (ref 70–99)
Glucose-Capillary: 85 mg/dL (ref 70–99)

## 2010-06-10 LAB — CBC
HCT: 27.2 % — ABNORMAL LOW (ref 36.0–46.0)
HCT: 28.2 % — ABNORMAL LOW (ref 36.0–46.0)
HCT: 29.2 % — ABNORMAL LOW (ref 36.0–46.0)
HCT: 30.7 % — ABNORMAL LOW (ref 36.0–46.0)
HCT: 33.6 % — ABNORMAL LOW (ref 36.0–46.0)
Hemoglobin: 10.3 g/dL — ABNORMAL LOW (ref 12.0–15.0)
Hemoglobin: 13.1 g/dL (ref 12.0–15.0)
Hemoglobin: 9.4 g/dL — ABNORMAL LOW (ref 12.0–15.0)
Hemoglobin: 9.8 g/dL — ABNORMAL LOW (ref 12.0–15.0)
MCHC: 33.4 g/dL (ref 30.0–36.0)
MCHC: 33.5 g/dL (ref 30.0–36.0)
MCHC: 33.5 g/dL (ref 30.0–36.0)
MCV: 80.3 fL (ref 78.0–100.0)
MCV: 81.1 fL (ref 78.0–100.0)
MCV: 81.4 fL (ref 78.0–100.0)
Platelets: 106 10*3/uL — ABNORMAL LOW (ref 150–400)
Platelets: ADEQUATE 10*3/uL (ref 150–400)
RBC: 3.48 MIL/uL — ABNORMAL LOW (ref 3.87–5.11)
RBC: 3.61 MIL/uL — ABNORMAL LOW (ref 3.87–5.11)
RBC: 3.77 MIL/uL — ABNORMAL LOW (ref 3.87–5.11)
RBC: 4.79 MIL/uL (ref 3.87–5.11)
RDW: 16.5 % — ABNORMAL HIGH (ref 11.5–15.5)
RDW: 16.9 % — ABNORMAL HIGH (ref 11.5–15.5)
RDW: 17.5 % — ABNORMAL HIGH (ref 11.5–15.5)
WBC: 6.3 10*3/uL (ref 4.0–10.5)
WBC: 6.3 10*3/uL (ref 4.0–10.5)
WBC: 9 10*3/uL (ref 4.0–10.5)

## 2010-06-10 LAB — DIFFERENTIAL
Basophils Absolute: 0 10*3/uL (ref 0.0–0.1)
Basophils Absolute: 0.1 10*3/uL (ref 0.0–0.1)
Basophils Relative: 1 % (ref 0–1)
Eosinophils Absolute: 0.2 10*3/uL (ref 0.0–0.7)
Eosinophils Absolute: 0.4 10*3/uL (ref 0.0–0.7)
Eosinophils Relative: 4 % (ref 0–5)
Eosinophils Relative: 4 % (ref 0–5)
Eosinophils Relative: 5 % (ref 0–5)
Lymphocytes Relative: 23 % (ref 12–46)
Monocytes Absolute: 0.4 10*3/uL (ref 0.1–1.0)
Monocytes Relative: 7 % (ref 3–12)
Neutro Abs: 3.2 10*3/uL (ref 1.7–7.7)

## 2010-06-10 LAB — ANAEROBIC CULTURE

## 2010-06-10 LAB — BRAIN NATRIURETIC PEPTIDE: Pro B Natriuretic peptide (BNP): 513 pg/mL — ABNORMAL HIGH (ref 0.0–100.0)

## 2010-06-10 LAB — BASIC METABOLIC PANEL
BUN: 21 mg/dL (ref 6–23)
BUN: 24 mg/dL — ABNORMAL HIGH (ref 6–23)
BUN: 28 mg/dL — ABNORMAL HIGH (ref 6–23)
BUN: 29 mg/dL — ABNORMAL HIGH (ref 6–23)
CO2: 24 mEq/L (ref 19–32)
CO2: 25 mEq/L (ref 19–32)
Calcium: 8.7 mg/dL (ref 8.4–10.5)
Calcium: 8.9 mg/dL (ref 8.4–10.5)
Calcium: 9 mg/dL (ref 8.4–10.5)
Calcium: 9.4 mg/dL (ref 8.4–10.5)
Chloride: 101 mEq/L (ref 96–112)
Chloride: 105 mEq/L (ref 96–112)
Chloride: 106 mEq/L (ref 96–112)
Creatinine, Ser: 1.1 mg/dL (ref 0.4–1.2)
Creatinine, Ser: 1.21 mg/dL — ABNORMAL HIGH (ref 0.4–1.2)
Creatinine, Ser: 1.36 mg/dL — ABNORMAL HIGH (ref 0.4–1.2)
GFR calc Af Amer: 51 mL/min — ABNORMAL LOW (ref >60)
GFR calc non Af Amer: 45 mL/min — ABNORMAL LOW (ref >60)
GFR calc non Af Amer: 46 mL/min — ABNORMAL LOW (ref >60)
GFR calc non Af Amer: 49 mL/min — ABNORMAL LOW (ref >60)
GFR calc non Af Amer: 50 mL/min — ABNORMAL LOW (ref >60)
Glucose, Bld: 115 mg/dL — ABNORMAL HIGH (ref 70–99)
Glucose, Bld: 128 mg/dL — ABNORMAL HIGH (ref 70–99)
Glucose, Bld: 129 mg/dL — ABNORMAL HIGH (ref 70–99)
Glucose, Bld: 134 mg/dL — ABNORMAL HIGH (ref 70–99)
Glucose, Bld: 186 mg/dL — ABNORMAL HIGH (ref 70–99)
Potassium: 4.2 mEq/L (ref 3.5–5.1)
Potassium: 4.2 mEq/L (ref 3.5–5.1)
Potassium: 4.3 mEq/L (ref 3.5–5.1)
Potassium: 4.5 mEq/L (ref 3.5–5.1)
Sodium: 134 mEq/L — ABNORMAL LOW (ref 135–145)
Sodium: 141 mEq/L (ref 135–145)

## 2010-06-10 LAB — POCT CARDIAC MARKERS
CKMB, poc: 2 ng/mL (ref 1.0–8.0)
Troponin i, poc: <0.05 ng/mL (ref 0.00–0.09)

## 2010-06-10 LAB — TISSUE CULTURE

## 2010-06-10 LAB — URINALYSIS, ROUTINE W REFLEX MICROSCOPIC
Glucose, UA: 100 mg/dL — AB
Protein, ur: NEGATIVE mg/dL
pH: 5 (ref 5.0–8.0)

## 2010-06-10 LAB — SEDIMENTATION RATE: Sed Rate: 30 mm/hr — ABNORMAL HIGH (ref 0–22)

## 2010-06-10 LAB — CULTURE, BLOOD (ROUTINE X 2)

## 2010-06-10 LAB — CARDIAC PANEL(CRET KIN+CKTOT+MB+TROPI)
CK, MB: 2.9 ng/mL (ref 0.3–4.0)
Relative Index: 2.8 — ABNORMAL HIGH (ref 0.0–2.5)
Relative Index: INVALID (ref 0.0–2.5)
Total CK: 104 U/L (ref 7–177)
Troponin I: 0.01 ng/mL (ref 0.00–0.06)
Troponin I: 0.03 ng/mL (ref 0.00–0.06)

## 2010-06-10 LAB — COMPREHENSIVE METABOLIC PANEL
ALT: 29 U/L (ref 0–35)
AST: 39 U/L — ABNORMAL HIGH (ref 0–37)
Alkaline Phosphatase: 79 U/L (ref 39–117)
CO2: 26 mEq/L (ref 19–32)
Chloride: 105 mEq/L (ref 96–112)
GFR calc Af Amer: 59 mL/min — ABNORMAL LOW (ref >60)
GFR calc non Af Amer: 49 mL/min — ABNORMAL LOW (ref >60)
Sodium: 137 mEq/L (ref 135–145)
Total Bilirubin: 0.6 mg/dL (ref 0.3–1.2)

## 2010-06-10 LAB — CK TOTAL AND CKMB (NOT AT ARMC): CK, MB: 3.3 ng/mL (ref 0.3–4.0)

## 2010-06-10 LAB — VANCOMYCIN, TROUGH: Vancomycin Tr: 11.8 ug/mL (ref 10.0–20.0)

## 2010-06-10 LAB — GRAM STAIN

## 2010-06-10 LAB — PROTIME-INR: Prothrombin Time: 14.3 seconds (ref 11.6–15.2)

## 2010-08-04 NOTE — Assessment & Plan Note (Signed)
OFFICE VISIT   LYNNELLE, MESMER K  DOB:  [birth year]                                       09/01/2006  [id number]#:[id number]   The patient is status post left great toe amputation July 01, 2006 at  [facility].  She did have some erythema and drainage from the  incision site and was seen in the emergency department approximately 1  month ago.  She was seen by Dr. Arbie Cookey 08/08/2006, followed by an office  visit with me on 08/18/2006.   Her most recent course of antibiotics include clindamycin 300 mg p.o.  q.6h for 10 days, and Levaquin 500 mg daily for 10 days.  She soaks the  foot daily.   She has had no further drainage.  No redness or tenderness.   A culture obtained from the suture line on her last visit revealed no  growth.   The wound at this time looks intact.  No drainage.  Minimal erythema.  2+ left dorsalis pedis pulse.  BP 144/70, pulse 67 per minute, respirations 16 per minute.   We will plan to follow up in approximately 4 to 6 weeks.  Continue  soaks.  Remain off antibiotics at this time.   Balinda Quails, M.D.  Electronically Signed   PGH/MEDQ  D:  09/01/2006  T:  09/02/2006  Job:  [id number]   cc:   Dr. Lafonda Mosses, M.D.  Darlin Priestly, MD

## 2010-08-04 NOTE — Assessment & Plan Note (Signed)
OFFICE VISIT   [name], Brenda K  DOB:  [birth year]                                       08/18/2006  [id number]#:[id number]   Brenda [name] is status post left great toe amputation carried out July 01, 2006 at [facility].  This was an uneventful procedure.  She was  seen back in the office in follow-up and things seemed to be going  fairly well.  She, however, developed some erythema and drainage from  the site and was seen in the emergency department and referred back to  the office, evaluated by Dr. Arbie Cookey on Aug 08, 2006.  At that time she  was reassured, there was some mild erythema. Otherwise the amputation  site was felt to doing fairly well.  She has been on Levaquin and  returned to the office at this time.   The patient amputation site is intact.  There is some mild erythema over  the dorsum of the foot, 2+ left dorsalis pedis pulse.  No significant  drainage.  Mild tenderness.   Blood pressure is 143/70, pulse is 68 per minute and regular,  respirations 16 per minute.   Placed on clindamycin 300 mg q.6 hours for 10 days, Levaquin 500 mg  daily for 10 more days.  To return to the office in 2 weeks.  Continue  soaks.  Call if any worsening.   Balinda Quails, M.D.  Electronically Signed   PGH/MEDQ  D:  08/18/2006  T:  08/19/2006  Job:  [id number]   cc:   Brooke Bonito, M.D.  Darlin Priestly, MD

## 2010-08-04 NOTE — Assessment & Plan Note (Signed)
OFFICE VISIT   Brenda [name], Brenda [name]  DOB:  06-04-1945                                       10/13/2006  [id number]#:[id number]   The patient returned today with plain x-rays of her left foot carried  out today.  This does reveal some mottling appearance to the first  metatarsal head suggestive of osteomyelitis.  However, this was present  Aug 05, 2006 also.  She has had no further drainage from her left great  toe amputation site, and this may represent chronic changes.   EVALUATION:  BP 159/75, pulse 75 per minute and regular, respirations 18  per minute.  Left great toe amputation site is not draining.  No  tenderness or erythema.   We will plan follow up in 3 months to see what her clinical course is  like.  Discussed the potential for further amputation needed in the left  foot.   Balinda Quails, M.D.  Electronically Signed   PGH/MEDQ  D:  10/13/2006  T:  10/14/2006  Job:  [id number]

## 2010-08-04 NOTE — Op Note (Signed)
Brenda [name], Brenda [name]               ACCOUNT NO.:  [id number]   MEDICAL RECORD NO.:  [id number]          PATIENT TYPE:  AMB   LOCATION:  ENDO                         FACILITY:  [facility]   PHYSICIAN:  Georgiana Spinner, M.D.    DATE OF BIRTH:  02-Nov-1945   DATE OF PROCEDURE:  DATE OF DISCHARGE:                               OPERATIVE REPORT   PROCEDURE:  Colonoscopy.   INDICATIONS:  Colon polyps, rectal bleeding.   ANESTHESIA:  Fentanyl 50 mcg, Versed 4 mg.   PROCEDURE:  With the patient mildly sedated in the left lateral  decubitus position, the Pentax videoscopic colonoscope was inserted in  the rectum and passed under direct vision to the cecum identified by  base of cecum and ileocecal valve, both of which were photographed.  From this point the colonoscope was slowly withdrawn, taking  circumferential views of the colonic mucosa, stopping only in the rectum  which appeared normal, other than a small polyp which was removed using  hot biopsy forceps technique, setting of 20/150 blended current, and on  retroflexed view showed hemorrhoids.  The endoscope was straightened and  withdrawn.  The patient's vital signs and pulse oximeter remained  stable.  The patient tolerated the procedure well without apparent  complications.   FINDINGS:  Polyp of rectum, internal hemorrhoids.  Otherwise, an  unremarkable exam.   PLAN:  Await biopsy report.  The patient will call me for results and  follow up with me as needed as an outpatient.           ______________________________  Georgiana Spinner, M.D.     GMO/MEDQ  D:  05/16/2007  T:  05/16/2007  Job:  [id number]

## 2010-08-04 NOTE — Op Note (Signed)
NAMEPAULEEN, Brenda [name]               ACCOUNT NO.:  [id number]   MEDICAL RECORD NO.:  [id number]          PATIENT TYPE:  AMB   LOCATION:  ENDO                         FACILITY:  [facility]   PHYSICIAN:  Georgiana Spinner, M.D.    DATE OF BIRTH:  [birth year]   DATE OF PROCEDURE:  05/16/2007  DATE OF DISCHARGE:                               OPERATIVE REPORT   PROCEDURE:  Upper endoscopy.   INDICATIONS:  Hemoccult positivity.   ANESTHESIA:  Fentanyl 50 mcg, Versed 4 mg.   DESCRIPTION OF PROCEDURE:  With the patient mildly sedated in the left  lateral decubitus position, the Pentax videoscopic endoscope inserted  into the mouth and passed under direct vision through the esophagus  which appeared normal, into the stomach; and fundus, body, antrum,  duodenal bulb, second portion of the duodenum all appeared normal.  From  this point, the endoscope was slowly withdrawn taking circumferential  views of duodenal mucosa until the endoscope had been pulled back and  the stomach placed in retroflexion to view the stomach from below.  The  endoscope was straightened and withdrawn, taking circumferential views,  of the remaining gastric and esophageal mucosa.  The patient's vital  signs and pulse oximeter remained stable.  The patient tolerated the  procedure well without apparent complications.   FINDINGS:  Normal examination.   PLAN:  Proceed to colonoscopy.           ______________________________  Georgiana Spinner, M.D.     GMO/MEDQ  D:  05/16/2007  T:  05/16/2007  Job:  [id number]

## 2010-08-04 NOTE — Assessment & Plan Note (Signed)
OFFICE VISIT   [name], Brenda K  DOB:  September 20, 1945                                       01/05/2007  [id number]#:[id number]   The patient continues to follow up regarding her feet.  She underwent a  left great toe amputation 07/01/2006.  Had some mild wound breakdown  following this with a suggestion of possible first metatarsal  osteomyelitis.  This has all healed now.  She has had no further  drainage from the site.   She complains primarily of some pain over the second metatarsal head.  There is no ulcer or callus present.   She has 2+ dorsalis pedis pulses bilaterally.  BP 134/63, pulse 59, and  respirations 16.   Feet remain stable.  She is going to follow up with a podiatrist  regarding fittings for shoes.  Will return here in 6 months for  continued surveillance.   Balinda Quails, M.D.  Electronically Signed   PGH/MEDQ  D:  01/05/2007  T:  01/07/2007  Job:  [id number]

## 2010-08-07 NOTE — Discharge Summary (Signed)
Brenda [name], Brenda [name]                         ACCOUNT NO.:  [id number]   MEDICAL RECORD NO.:  [id number]                   PATIENT TYPE:  INP   LOCATION:  2022                                 FACILITY:  [facility]   PHYSICIAN:  Salvatore Decent. Dorris Fetch, M.D.         DATE OF BIRTH:  07-21-1945   DATE OF ADMISSION:  06/28/2003  DATE OF DISCHARGE:  07/15/2003                                 DISCHARGE SUMMARY   ADMISSION DIAGNOSIS:  Chest pain, rule out unstable angina.   DISCHARGE DIAGNOSES:  1. History of known coronary artery disease, status post multiple previous     percutaneous interventions.  2. Severe three vessel coronary artery disease, not amenable to percutaneous     intervention, now status post coronary artery bypass grafting x4     completed on July 08, 2003.  3. Type 2 insulin-dependent diabetes mellitus.  4. Dyslipidemia.  5. Hypertension.  6. Fibromyalgia.  7. Peripheral neuropathy.  8. Bilateral carpal tunnel syndrome.  9. History of deviated nasal septum.  10.      Chronic renal insufficiency.   PROCEDURE:  1. Cardiac catheterization completed on July 02, 2003.  2. Preliminary arterial evaluation in anticipation of cardiac surgery,     including bilateral carotid Duplex, upper extremity evaluation, and     bilateral ABI's on July 02, 2003.  3. Bilateral saphenous vein mapping of the lower extremities on July 04, 2003.  4. Bilateral lower extremity venous Duplex.  5. Coronary artery bypass grafting x4 on July 08, 2003.   CONSULTATIONS:  1. Cardiac rehab.  2. [facility] consultation for uncontrolled diabetes.  3. Diabetes management.  4. Case management.   HISTORY OF PRESENT ILLNESS:  The patient is a 65 year old white woman who  has an extensive history of coronary artery disease.  She had previously  undergone numerous percutaneous coronary interventions, with her last  procedure being a cutting balloon angioplasty to the circumflex on March 21, 2003.   The patient presented to the emergency department on June 28, 2003, with a history of chest pain that had began in the morning.  Chest  discomfort had waxed and waned throughout the course of the day and the  patient described it as an anterior chest pressure.  This radiated to her  arms, elbows, neck, and jaw bilaterally.  She did not associate this with  dyspnea, diaphoresis, or nausea.  There were no exacerbating or ameliorating  factors.  The patient reported that this chest pain was the same in nature  as her prior cardiac chest pain.  The patient was seen in the emergency  department by Dr. Kem Parkinson who felt that the patient should be admitted to  rule out unstable angina and possible myocardial infarction.  The patient  was then admitted under Dr. Jenne Campus for further evaluation.   HOSPITAL COURSE:  The patient was admitted to [facility] on June 29, 2003,  with chest pain and the plan to rule out myocardial infarction.  She was started on appropriate therapy including telemetry, aspirin, IV  heparin, IV nitroglycerin, and Plavix.  Serial cardiac enzymes were drawn,  which were negative for myocardial infarction.  The plan was then made for  the patient to undergo cardiac catheterization for further evaluation.   The patient underwent cardiac catheterization done by [facility] R. Jacinto Halim, M.D. on  July 02, 2003.  This revealed a 70% stenosis in her large mid right  coronary artery, ostial 70% stenosis of the circumflex with 95% proximal in-  stent restenosis of the circumflex and subtotally occluded mid to distal  circumflex.  An obtuse marginal had an in-stent 99% stenosis.  There was  noncritical coronary artery disease of the left anterior descending artery.  Given the complex nature of the patient's anatomy, Dr. Jacinto Halim felt she would  benefit from coronary artery bypass grafting.  A [facility] consultation was then  obtained.  Dr. Dorris Fetch of [facility] responded appropriately to the  cardiac  surgery consultation on July 02, 2003.  His impression was that indeed the  patient has known coronary artery disease and at catheterization, had severe  disease in her left circumflex.  Dr. Dorris Fetch felt that the indications  for bypass grafting in this case would be myocardial preservation and relief  of symptoms, but not a strong survival benefit due to the lack of critical  LAD disease.  In his opinion, he felt the LAD could be grafted in addition  to the right coronary artery, obtuse marginal branch #1 and 2.  He discussed  the risks, benefits, and alternatives to the procedure with the patient and  her family.  They were in understanding and wished to proceed with surgery.  With the patient's history of longterm Plavix therapy, the plan was made for  Plavix to be discontinued and a waiting period of 5 to 7 days prior to  surgery.  Therefore, surgery was planned for Monday, July 08, 2003.  If the  patient were to develop any unstable symptoms in the interim, we would have  proceed with surgery earlier.   Over the next several hospital days, the patient had a few episodes of chest  pain which were controlled easily with IV nitroglycerin.  She underwent  preoperative evaluation which included bilateral carotid Duplexes.  This  showed no evidence of internal carotid artery stenosis bilaterally.  Bilateral upper extremity examination showed palmar arch tests within normal  limits.  Bilateral ABI's were completed and were both within normal limits  at greater than 1.0. The patient also underwent bilateral greater saphenous  vein mapping and venous Duplexes on July 04, 2003.  This showed no evidence  of DVT, superficial thrombosis, or Baker's cyst.  The greater saphenous vein  appeared patent without thickened walls.   An [facility] hospitalist consultation was obtained by the cardiology service on July 06, 2003, for uncontrolled diabetes mellitus requiring increasing   doses of insulin.  The patient's hemoglobin A1C was 8.6 which reveals a very  poor outpatient control.  Their recommendations were for to her change her  diet to a low carbohydrate modified diet and increase her daily Lantus  Insulin to 95 units subcutaneous twice daily and then titrate up as needed.  They also recommended to add Starlix 120 mg t.i.d. before meals.  The  patient's blood sugars slowly trended toward normal, but were still  difficult to manage.   The patient was taken to the operating  room as planned on July 08, 2003,  and underwent coronary artery bypass grafting x4, utilizing the left  internal mammary artery to the left anterior descending coronary artery,  saphenous vein graft to the distal right coronary, sequential saphenous vein  graft to the obtuse marginal #1 and #2.  The greater saphenous vein was  endoscopically harvested from the right thigh without difficulty.  Overall,  the patient tolerated the procedure well and was transferred to the surgical  intensive care unit in stable condition.   The patient was successfully extubated on the evening of surgery without any  difficulty.  She did have episodes of atrial fibrillation which was atrially  paced, short term.  Ultimately, she resumed normal sinus rhythm without any  further difficulty.  The patient progressed through a routine postoperative  course.  Her chest tubes were discontinued in a stepwise fashion without  difficulty.  She was started on cardiac rehab phase I protocol.  She was  transferred to the stepdown unit on postoperative day #1.  While on the  stepdown unit, the patient's main problem was that of constipation and an  elevated white count.  A urinalysis ultimately showed a urinary tract  infection that the patient was treated appropriately with antibiotics.  She  was given appropriate bowel management and ultimately resumed normal bowel  function.  Her white blood cell count trended toward  normal by the time of  discharge.  Her glucose levels trended toward normal with increasing doses  of insulin.   The patient was deemed appropriate for initiation of discharge planning and  then on postoperative day #6 or July 14, 2003.  At the time of initiation  of discharge planning, the patient's physical examination was as follows:  She was afebrile and her vital signs were stable with blood pressure of  112/62, pulse 61, respirations 20, and O2 of 94 to 97% on room air.  Bedside  glucose measurements were 273, 148, 132, and 151 within the last 24 hour  period.  Her heart was in a regular rate and rhythm, reading normal sinus  rhythm on telemetry.  Her lungs were clear to auscultation.  Her abdomen was  benign.  Her extremities were without edema and she had 2+ posterior tibial  pulses bilaterally.  Her sternum was stable and clean, dry, and intact.  Her right lower extremity was healing well from vein harvesting.  The patient  was ambulating in the hallway without difficulty.  She was tolerating a  regular diet.   LABORATORY DATA:  At the time of initiation of discharge planning; CBC shows  WBC 11.2, hemoglobin 11.0, hematocrit 32.6, platelet count 169.  BMET from  April 22 showed sodium 135, potassium 5.3, chloride 102, CO2 25, glucoses  under 94, BUN 37, and creatinine 1.3.   We will continue to plan with discharge on July 15, 2003, pending a.m.  rounds and no change in the patient's clinical status.   DISCHARGE MEDICATIONS:  1. Aspirin 81 mg p.o. daily.  2. Atenolol 25 mg p.o. daily.  3. Lotensin 20 mg p.o. daily.  4. Tricor 48 mg p.o. daily.  5. Lipitor 10 mg p.o. daily.  6. Lantus Insulin 60 units subcu b.i.d.  7. Prevacid 15 to 30 mg p.o. daily.  8. Flonase two sprays per nostril daily.  9. Claritin 10 mg p.o. daily.  10.      Skelaxin 800 mg p.o. b.i.d.  11.      Ciprofloxacin 250 mg p.o. b.i.d.  for two more days.  12.      Starlix 120 mg p.o. t.i.d.  13.       Ultram one to two tablets every four to six hours as needed for     pain.   DISCHARGE INSTRUCTIONS:  1. Activity; no driving, heavy lifting, or strenuous activity.  Continue to     walk daily.  Continue breathing exercises x1 week.  2. Diet; the patient should follow a low fat, low salt, carbohydrate     modified medium calorie diet.  3. Wound care; the patient may shower. She should wash her incisions daily     with soap and water. She should notify the CVTS office if she has any     increased redness, swelling, or drainage from her incisions.   FOLLOW UP:  1. The patient is to see Dr. Jenne Campus of [facility] within two     weeks of discharge. She is to call his office at [phone] to set up     this appointment date and time.  2. The patient is to see Dr. Dorris Fetch of [facility] within three weeks of     discharge.  The CVTS Office will call the patient with this appointment     date and time. She will also undergo a chest x-ray from [facility] on that day.  She is to hand carry these films to Dr.     Sunday Corn office.  3. The patient is to follow up with Dr. Juleen China within three to six weeks of     discharge. She is to call his office at [phone] to set up this     appointment date and time.      Carolyn A. Eustaquio Boyden.                  Salvatore Decent Dorris Fetch, M.D.    CAF/MEDQ  D:  07/14/2003  T:  07/15/2003  Job:  [id number]   cc:   Darlin Priestly, M.D.  [phone] [address], [address]  [place]  [place] [address]  Fax: [phone]   Brooke Bonito, M.D.  [address]  [place]  [place] [address]  Fax: [phone]

## 2010-08-07 NOTE — Cardiovascular Report (Signed)
Brenda [name], Brenda [name]               ACCOUNT NO.:  [id number]   MEDICAL RECORD NO.:  [id number]          PATIENT TYPE:  OIB   LOCATION:  2899                         FACILITY:  [facility]   PHYSICIAN:  Darlin Priestly, MD  DATE OF BIRTH:  Oct 22, 1945   DATE OF PROCEDURE:  06/08/2006  DATE OF DISCHARGE:                            CARDIAC CATHETERIZATION   PROCEDURES:  1. Left heart catheterization.  2. Coronary angiography.  3. Left ventriculogram.  4. Vein grafts.  5. Left internal mammary angiography..   COMPLICATIONS:  None.   INDICATIONS:  Brenda [name] is a 65 year old female, patient of Dr. Adela Lank with a history of fibromyalgia, diabetes, PVD, history of known  CAD with initial circumflex intervention with subsequent multiple  percutaneous interventions of the obtuse marginal, ultimately went to a  bypass surgery consisting of a LIMA to the LAD, sequential vein graft of  the OM-1 and OM-2, and vein graft to the RCA.  The patient has recently  developed increasing chest pain, and shortness of breath.  She is now  referred for repeat catheterization to reassess her coronary anatomy.   DESCRIPTION OF PROCEDURE:  After patient gave informed consent, the  patient was brought to the cardiac cath lab; and right groin shaved,  prepped, and draped in a sterile fashion.  After monitored anesthesia  was established using the modified Seldinger technique, a number 6-  French arterial sheath was inserted into the right femoral artery.  A 6-  [place] diagnostic catheter was used to perform diagnostic angiography.   FINDINGS:  1. Left main is a medium-sized vessel with no significant disease.  2. LAD is a medium-sized vessel which is totally occluded in its      proximal portion.  The mid-and-distal LAD filled via a patent LIMA      which inserts in the midportion of the LAD.  Retrograde fills one      higher diagonal; this was a second diagonal.  The IMA has no      significant disease.   There does not appear to be significant      disease distal to the IMA insertion.  The first and second      diagonals both appear to be normal with no significant disease.  3. Left circumflex is a medium-sized vessel with multiple overlapping      stents noted in its proximal and mid segment extending into the      second obtuse marginal.  There is a 50% ostial lesion as well as a      diffuse InStent restenosis of the second stent.  The AV groove is a      small vessel.  The first and second OMs are not visualized in      antegrade fashion.  4. There is a patent saphenous vein graft which inserts into the      midportion of the first OM.  There does not appear to be any      significant disease in the graft or distal to the graft insertion.      By the operative report, this  was initially a sequential graft to      the lower OM, though the lower sequential portion is not      visualized.  The second OM is visualized with faint right-to-left      collaterals from the RCA.  5. The right coronary artery is a large vessel which is dominant.  It      fills into a PDS of the first lateral branch.  There is 60% mid and      70% distal stenosis.  There is a patent vein graft with insertion      into the distal portion of the RCA.  There is no significant      disease in the body the graft or distal graft insertion.  As      previously stated, there are right-to-left collaterals to the small      second obtuse marginal.   LEFT VENTRICULOGRAM:  Reveals a preserved EF of 50%.   HEMODYNAMIC RESULTS:  Arterial system pressure 130/55, LV system  pressure 130/7, LVDP of 23.   CONCLUSIONS:  1. Significant 3-vessel CAD.  2. Patent LIMA to the LAD with no disease beyond IMA insertion.  3. Patent vein graft to the first obtuse marginal though the lower      limb of the bypass graft was not visualized.  The second OM does      fill via right-to-left collaterals from the RCA.  4. Patent vein graft to  the distal RCA with no significant disease      beyond the graft insertion.  5. Normal LV systolic function.  6. Elevated LVEDP.      Darlin Priestly, MD  Electronically Signed     RHM/MEDQ  D:  06/08/2006  T:  06/08/2006  Job:  [id number]   cc:   Brooke Bonito, M.D.  Darlin Priestly, MD

## 2010-08-07 NOTE — Op Note (Signed)
   NAMEMACKINZEE, Brenda [name], M.D.                 DATE OF BIRTH:  29-Sep-1945   DATE OF PROCEDURE:  10/03/2002  DATE OF DISCHARGE:                                 OPERATIVE REPORT   PROCEDURE:  Upper endoscopy.   INDICATIONS:  GI bleed.   ANESTHESIA:  Demerol 60 mg, Versed 6 mg.   PROCEDURE:  With the patient mildly sedated in the left lateral decubitus  position, the Olympus videoscopic endoscopic inserted in the mouth, passed  under direct vision through the esophagus, which appeared normal until we  reached the distal esophagus.  It appeared to be slightly inflamed at the  junction of the gastric mucosa.  This was photographed and biopsied.  We  entered into the stomach.  The fundus, body appeared normal.  The antrum  showed some changes of mild minute punctate erosions.  They were  photographed and biopsied.  The duodenal bulb and second portion of duodenum  appeared normal.  From this point the endoscope was slowly withdrawn, taking  circumferential views of the duodenal mucosa until the endoscope had been  pulled back into the stomach, placed in retroflexion to view the stomach  from below, and this revealed a loose wrap of the GE junction around the  endoscope, which was photographed.  The endoscope was straightened and  withdrawn, taking circumferential views of the remaining gastric and  esophageal mucosa.  The patient's vital signs and pulse oximetry remained  stable.  The patient tolerated the procedure well without apparent  complications.   FINDINGS:  1. Loose wrap of the gastroesophageal junction with some mild changes of the     gastroesophageal junction, biopsied.  2. Also punctate erosions in the antrum, biopsied.   Await biopsy report.  The patient  will call me for results and follow up  with me as an outpatient.  Proceed to colonoscopy as planned.                                                Georgiana Spinner, M.D.    GMO/MEDQ  D:  10/03/2002  T:  10/03/2002  Job:  [id number]

## 2010-08-07 NOTE — Cardiovascular Report (Signed)
Brenda [name], Brenda [name]                         ACCOUNT NO.:  [id number]   MEDICAL RECORD NO.:  [id number]                   PATIENT TYPE:  OIB   LOCATION:  6531                                 FACILITY:  [facility]   PHYSICIAN:  Darlin Priestly, M.D.             DATE OF BIRTH:  13-Dec-1945   DATE OF PROCEDURE:  08/07/2002  DATE OF DISCHARGE:                              CARDIAC CATHETERIZATION   PROCEDURES:  1. Left heart catheterization.  2. Coronary angiography.  3. Left ventriculogram.  4. Left circumflex - mid:     a. Cutting balloon angioplasty.     b. Brachytherapy.  5. OM1 - ostial:     a. Percutaneous transluminal coronary balloon angioplasty.   ATTENDING PHYSICIAN:  Darlin Priestly, M.D.   COMPLICATIONS:  None.   INDICATIONS:  Ms. Brenda [name] is a 65 year old female patient of Dr. Adela Lank  and Dr. Lenise Herald with a history non-insulin dependent diabetes  mellitus, history of total occluded circumflex for which she underwent  stenting in 2000.  The patient was recathed in January 2004 with in-stent  restenosis with cutting balloon angioplasty and placement of a Cypher stent  in distal AV groove circumflex extending into the second obtuse marginal.  She has redeveloped chest pain.  She is here now for repeat catheterization.   DESCRIPTION OF OPERATION:  After getting informed written consent, the  patient was brought to the cardiac catheterization lab.  The right groin was  shaved, prepped and draped in the usual sterile fashion.  ECG monitor  established.  Using modified Seldinger technique, a 6 French arterial sheath  was inserted in the right femoral artery.  A 6 French diagnostic catheter  was then used to perform diagnostic angiography.  This revealed a large left  main with no significant disease.  The LAD was a large vessel that coursed  to the apex and rise to one diagonal branch.  The LAD is noted to have some  mild 40% mid vessel narrowing.  The first  diagonal is a medium-size vessel  which bifurcates distally and has no significant disease.   Left circumflex was a medium-size vessel coursing in AV groove and there  were two obtuse marginal branches.  There were two stents noted in the mid  circumflex as well as a stent extending to the second obtuse marginal.  There was diffuse in-stent restenosis of the proximal stent up to 70%.  There was also diffuse in-stent restenosis up to 90% in the mid stent.  The  Cypher stent in the distal AV groove circ extended to the second obtuse  marginal was widely patent.   The first obtuse marginal was a medium-size vessel with 99% ostial stenosis.  The second obtuse marginal was a medium-size vessel with no significant  disease.   The right coronary artery was a large vessel which was dominant and gave  rise to the PDA as well  as posterolateral branch.  There was a 60% mid RCA  stenosis.  There was a 30% distal RCA  stenosis.  PDA and posterolateral  branches had no significant disease.   Left ventriculogram revealed a preserved EF of 60%.   HEMODYNAMICS:  Systemic arterial pressure 175/77, LV pressure 181/13, LVEDP  of 22.   INTERVENTIONAL PROCEDURE:  Following diagnostic angiography, the #6 [place]  then exchanged for a 7 [place] sheath and  7 [place] JL-4 guiding catheter was  coaxially engaged in the left coronary ostium.  Next, a 0.014 Forte guide  wire was advanced down the guiding catheter and positioned in distal second  OM without difficulty.  A second 0.014 Forte guide wire was then positioned  in the first OM distally with no complication.  Next, a 2.75 x 10-mm cutting  balloon was then tracked into the mid in-stent restenosis and multiple  inflations then performed throughout the mid and proximal stent.  This was  performed up to 6 atmospheres.  Followup angiography revealed no evidence of  dissection or thrombus.  However, the proximal stent appeared somewhat under-  deployed.  This  balloon was then exchanged for a 3.0 x 10 cutting balloon  which was positioned in the proximal AV groove circumflex stent and two  inflations to 6 atmospheres were performed for a total of approximately 1  minute 30 seconds.  Followup angiogram revealed no evidence of dissection or  thrombus with TIMI 3 flow to the distal vessel.  Next, a 2.5 x 15-mm  Maverick was then tracked across the ostium of the first obtuse marginal and  three subsequent inflations to a maximum of 10 atmospheres were performed  for a total of approximately 2-1/2 minutes.  Followup angiogram revealed  moderate amount of recoil with probably 50% residual stenosis in the OM.  At  this point, the balloon was removed and a 3.0 x 52-mm brachytherapy catheter  was then delivered into the proximal portion of the second obtuse marginal  with careful attention to extend this into the proximal portion of the  circumflex.  Next, a beta radiation wire Guidant system was then tested and  was then deployed into the stent and pullback over the course of  approximately eight minutes was performed by the machine.  Once the  __________ time was completed, this catheter was removed and the wire was  confirmed back in the machine.  The catheter was removed.  The followup  angiogram revealed no evidence of dissection or thrombus with TIMI 3 flow to  the distal vessel.  IV Integrelin was used throughout the case.  IV heparin  was given to maintain ACT between 200 and 300.   Final orthogonal angiograms revealed less than 10% residual stenosis in the  circumflex with TIMI 3 flow to the distal vessel.  Additionally, there  appeared to be 50%-60% ostial OM-1 stenotic lesion.  At this point we  elected to conclude the procedure.  All balloons, wires and catheters were  removed.  Hemostatic sheath was sewn in place.  The patient transferred back  to the ward in stable condition.   CONCLUSION: 1. Successful cutting balloon angioplasty of the  mid AV groove circumflex in-     stent restenosis with subsequent brachytherapy using beta radiation.  2. Successful percutaneous transluminal cutting balloon angioplasty of the     first obtuse marginal ostial stenotic lesion.  3. Normal left ventricular systolic function.  4. Adjunct use of Integrelin infusion.  Darlin Priestly, M.D.   RHM/MEDQ  D:  08/07/2002  T:  08/08/2002  Job:  [id number]   cc:   Brooke Bonito, M.D.  [address]  [place]  [place] [address]  Fax: [phone]

## 2010-08-07 NOTE — Discharge Summary (Signed)
NAMEPERCY, Brenda [name]               ACCOUNT NO.:  [id number]   MEDICAL RECORD NO.:  [id number]          PATIENT TYPE:  INP   LOCATION:  3708                         FACILITY:  [facility]   PHYSICIAN:  Madaline Savage, MD        DATE OF BIRTH:  1945-07-24   DATE OF ADMISSION:  06/29/2006  DATE OF DISCHARGE:  07/04/2006                               DISCHARGE SUMMARY   PRIMARY CARE PHYSICIAN:  Brooke Bonito, M.D.   CONSULTATIONS:  In the hospital Brenda [name] was seen by Dr. Madilyn Fireman   DISCHARGE DIAGNOSES:  1. Osteomyelitis of the left big toe status post amputation.  2. Diabetes mellitus type 2.  3. Hypertension.  4. Dyslipidemia.  5. Intermittent bright red blood per rectum.   DISCHARGE MEDICATIONS:  1. Avelox 400 mg once daily for one week.  2. Oxycodone 5 mg every six hours as needed, 20 pills were dispensed.  3. Byetta #5 three times daily.  4. Levimir 54 units twice daily.  5. Plavix 75 mg once daily.  6. Atenolol 50 mg daily.  7. Amlodipine/Benazepril 5/10 once daily.  8. Aciphex 20 mg daily.  9. Crestor 10 mg daily.  10.Aspirin 81 mg daily.  11.Skelaxin 800 mg three times daily.  12.Sliding scale insulin as before.   PROCEDURE PERFORMED:  1. Brenda [name] had an x-ray of foot done on 06/29/2006 which showed tiny area      of cortical discontinuity involving the left foot.  This may      represent an erosion of osteomyelitis.  2. Brenda [name] had an MRI of the left foot done on 06/30/2006 which shows a      mild edema in the distal phalanx of great toe compatible with      osteomyelitis.  Soft tissue edema diffusely about the foot is      consistent with cellulitis/myositis.   HISTORY OF PRESENT ILLNESS:  For full history and physical, see history  and physical dictated by Dr. Sherrie Mustache on 06/29/2006.  In short, Brenda [name]  is a 65 year old lady with a history of coronary artery disease,  diabetes mellitus, who comes in with a left great toe pain swelling and  redness.  Initial evaluation in the ED showed  that Brenda [name] had questionable  osteomyelitis in her great toe.  Brenda [name] also had a fever on admission.  Brenda [name]  was started with antibiotics, Cleocin and Levaquin while in the  hospital.   PROBLEMS:  1. OSTEOMYELITIS OF THE LEFT GREAT TOE.  We consulted at [facility] for a      positive MRI.  Brenda [name] underwent a left great toe amputation on      07/01/2006 by Dr. Madilyn Fireman.  While in the hospital, Brenda [name] was treated      with Cleocin and Levaquin at this time now be the consolidating her      antibiotics to Avelox for seven more days for her superficial      cellulitis.  Her wound has shown good healing, and Brenda [name] has been      cleared by Surgery to be discharged.  Brenda [name] will follow  up with Dr.      Madilyn Fireman in 1-2 weeks.  2. DIABETES MELLITUS.  We had put her back on her home medications      while in the hospital with good control of blood sugars.  We will      continue the same at home.   DISPOSITION:  Brenda [name] is now being discharged home in stable condition.   FOLLOWUP:  Brenda [name] is asked to follow-up with her primary care doctor, Dr.  Juleen China in one week.  Brenda [name] is also given the number of Dr. Madilyn Fireman which is  [phone].  Brenda [name] is asked to call and get an appointment to see Dr. Madilyn Fireman  in 1-2 weeks.      Madaline Savage, MD  Electronically Signed     PKN/MEDQ  D:  07/04/2006  T:  07/04/2006  Job:  [id number]

## 2010-08-07 NOTE — H&P (Signed)
Brenda [name], Brenda [name]               ACCOUNT NO.:  [id number]   MEDICAL RECORD NO.:  [id number]          PATIENT TYPE:  EMS   LOCATION:  MAJO                         FACILITY:  [facility]   PHYSICIAN:  Elliot Cousin, M.D.    DATE OF BIRTH:  09-Jan-1946   DATE OF ADMISSION:  06/29/2006  DATE OF DISCHARGE:                              HISTORY & PHYSICAL   PRIMARY CARE PHYSICIAN:  Dr. Juleen China.   CARDIOLOGIST:  Dr. Jenne Campus.   CHIEF COMPLAINT:  Left great toe pain, swelling and redness.   HISTORY OF PRESENT ILLNESS:  The patient is a 65 year old woman with a  past medical history significant for coronary artery disease, status  post CABG in April of 2005, type 2 diabetes mellitus and peripheral  neuropathy who presents to the emergency department with a chief  complaint of left great toe pain, redness and swelling.  Her symptoms  started approximately 4 days ago.  She treated it with an antibiotic  cream and by soaking it.  She also had some leftover Cleocin which she  took yesterday.  She is followed by podiatrist, Dr. Sherilyn Cooter, in [place], [place].  She has a chronically infected right foot which  he has been treating since 2001.  She wears a surgical shoe on her right  foot.  She has no complaints of an active right foot infection.  However, over the past 4-5 days, she has had progressive redness,  swelling and pain of the left great toe.  She denies any known trauma.  It has been draining a little red blood but no obvious purulent  drainage.  She has had associated fevers and chills.  Her temperature  got as high as 101 degrees Fahrenheit at home.  She has also had some  generalized nausea and poor appetite but no vomiting.   Her review of systems is positive for intermittent bright red blood per  rectum which has been mild to moderate in intensity for the past few  months.  She also has occasional left lower quadrant abdominal cramping  just before a bowel movement.  She is  scheduled for an upper endoscopy  and colonoscopy next week by Dr. Virginia Rochester.   During the evaluation in the emergency department, the patient is noted  to be hemodynamically stable and afebrile.  Her white blood cell count  is elevated at 14.8.  The x-ray of her left foot reveals a tiny area of  cortical discontinuity involving the tuft of the great toe, this may  represent an erosion of osteomyelitis.  The patient will therefore be  admitted for further evaluation and management.   PAST MEDICAL HISTORY:  1. Coronary artery disease, status post CABG in April of 2005.  The      patient had a history of multiple previous angioplasties and      stents.  Her recent cardiac catheterization was performed by Dr.      Jenne Campus on June 08, 2006.  Her LV function was normal.  Please see      the report.  2. Type 2 diabetes mellitus.  3.  Hypertension.  4. Dyslipidemia.  5. Peripheral neuropathy.  6. Chronic kidney disease.  7. History of bilateral carpal tunnel syndrome.  8. History of GI bleed.  9. EGD in July 2004 revealed erosions of the antrum and H. pylori      positive.  The patient was treated with antibiotics; however, she      could not tolerate them.  10.Colonoscopy in July of 2004 revealed internal hemorrhoids and      sigmoid diverticulosis (by Dr. Virginia Rochester).  11.Status post hysterectomy secondary to fibroids.  12.Status post ganglion cyst excision.  13.Multiple foot surgeries on the right.  14.History of chronic right foot ulcer.  15.Status post cholecystectomy.  16.Status post C-section.  17.Status post tonsillectomy.  18.Glaucoma.  19.Penicillin, sulfa and codeine allergies.  Intolerance to Flagyl.   MEDICATIONS:  1. Sliding scale NovoLog t.i.d.  2. Antara 130 mg daily.  3. Byetta 5 mg t.i.d.  4. Levemir 54 units b.i.d.  5. Plavix 75 mg daily.  6. Nitro-Quick 0.4 mg as needed.  7. Bumetanide 1 mg p.r.n.  8. Atenolol 50 mg daily.  9. Clarinex 5 mg daily.   10.Amlodipine/benazepril 5/10 mg daily.  11.AcipHex 20 mg daily.  12.Crestor 10 mg daily.  13.Dulcolax 1-2 tablets per night.  14.Aspirin 81 mg daily.  15.Skelaxin 800 mg t.i.d. p.r.n.  16.Ultram ER 200 mg as needed.  17.Nasonex 1 spray in each nostril daily.  18.Travatan eye drops 0.004% 1 drop in both eyes daily.  19.Lovaza 1 gram 3-4 times daily.   ALLERGIES:  1. The patient has an allergy to CODEINE.  2. PENICILLIN.  3. SULFA drugs.  4. She is intolerant to FLAGYL which causes nausea and vomiting.   SOCIAL HISTORY:  She is married.  She has one son.  She [place] in  [place]  She is disabled.  She denies tobacco,  alcohol and illicit drug use.   FAMILY HISTORY:  Her mother died at 24 years of age secondary to  complications from diabetes, heart disease and dementia.  Her father  died at 75 years of age secondary to complications of pneumonia.  He had  a history of coronary artery bypass grafting and Alzheimer's dementia.   REVIEW OF SYSTEMS:  As above in the history of present illness.   PHYSICAL EXAMINATION:  Temperature 98.2, blood pressure 133/64, pulse  74, respiratory rate 20, oxygen saturation 98% on room air.  GENERAL:  The patient is a pleasant 65 year old Caucasian woman who is  currently sitting up in bed in no acute distress.  HEENT:  Head is normocephalic and atraumatic.  Pupils are equal, round,  and reactive to light.  Extraocular movements are intact.  Conjunctivae  are clear.  Sclerae are white.  Oropharynx reveals moist mucus  membranes.  No posterior exudates or erythema.  Neck is supple.  No  adenopathy, no thyromegaly, no bruit, no JVD.  LUNGS:  Clear to auscultation bilaterally.  HEART:  S1-S2 with a soft systolic murmur.  CHEST WALL:  Well-healed sternotomy scar, nontender.  ABDOMEN:  Well-healed right upper quadrant scar, nontender.  Abdomen is  obese, positive bowel sounds, soft, mild tenderness at the left lower quadrant without  rebound, guarding, masses or hepatosplenomegaly.  GU/RECTAL:  Deferred.  EXTREMITIES:  Pedal pulses are 2+ bilaterally.  No pretibial edema and  no pedal edema.  The left great toe is moderately swollen and  erythematous, mildly to moderately tender.  Callus on the plantar  surface of the left great toe,  no active drainage.  NEUROLOGICAL:  The patient is alert and oriented x3.  Cranial nerves II  through XII are intact.  Strength is 5/5 throughout.  Sensation is  grossly decreased of the plantar surfaces bilaterally.   ADMISSION LABORATORIES:  X-ray results are above.  Sodium 134, potassium  3.8, chloride 104, BUN 16, glucose 116, bicarbonate 24, creatinine 1.3.  WBC 14.8.  Absolute neutrophil count 11.4, hemoglobin 12.2, platelets  161.   ASSESSMENT:  1. Left great toe infection/cellulitis:  The patient is currently      afebrile, however, she does report a fever at home.  She has a      moderate leukocytosis currently.  There is a concern regarding      osteomyelitis.  2. Review of systems positive for rectal bleeding.  The patient has a      history of internal hemorrhoids, diverticulosis and antral erosions      per colonoscopy and esophagogastroduodenoscopy in July of 2004.      Per her history, she is scheduled to have an      esophagogastroduodenoscopy and colonoscopy next week by Dr. Virginia Rochester.      Her hemoglobin appears to be stable.  3. Coronary artery disease:  The patient is followed by cardiologist,      Dr. Jenne Campus.  She recently underwent a cardiac catheterization on      June 08, 2006.  She has no complaints of chest pain.  4. Type 2 diabetes mellitus:  The patient's capillary blood sugar is      116 currently.  She is treated with multiple medications.   PLAN:  1. The patient will be admitted for further evaluation and management.  2. Blood cultures have been ordered by the emergency department      physician.  She also received 400 mg of intravenous Cipro.  3. We  will continue antibiotic treatment with Cleocin and Levaquin      intravenously.  4. For further evaluation, we will check a MRI of the left great toe.      We will also check ABI.  5. Consider vascular consultation.  6. Inform Dr. Virginia Rochester of the patient's admission (for possible in-hospital      evaluation of rectal bleeding).  7. Continue cardiac medications.  8. We will treat the patient's diabetes with Lantus and sliding scale      NovoLog for now.  9. Prophylactic Protonix and bilateral lower extremity compression      devices.  We will hold Lovenox and/or subcutaneous heparin      secondary to the history of rectal bleeding.      Elliot Cousin, M.D.  Electronically Signed     DF/MEDQ  D:  06/29/2006  T:  06/29/2006  Job:  [id number]   cc:   Brooke Bonito, M.D.  Darlin Priestly, MD

## 2010-08-07 NOTE — Cardiovascular Report (Signed)
Brenda [name], ARCHAMBEAULT                         ACCOUNT NO.:  [id number]   MEDICAL RECORD NO.:  [id number]                   PATIENT TYPE:  INP   LOCATION:  3703                                 FACILITY:  [facility]   PHYSICIAN:  Darlin Priestly, M.D.             DATE OF BIRTH:  11-19-1945   DATE OF PROCEDURE:  04/03/2002  DATE OF DISCHARGE:                              CARDIAC CATHETERIZATION   PROCEDURE:  1. Left heart catheterization.  2. Coronary angiography.  3. Left ventriculogram.  4. Left circumflex-mid - cutting balloon angioplasty.  5. Obtuse marginal-I-proximal - percutaneous transluminal coronary balloon     angioplasty.  6. Obtuse marginal-II-proximal - percutaneous transluminal coronary balloon     angioplasty.  7. Placement of intercoronary stent x4.   OPERATOR:  Darlin Priestly, M.D.   COMPLICATIONS:  None.   INDICATIONS FOR PROCEDURE:  This 65 year old female patient of Dr. Lenise Herald, with a history of insulin-dependent diabetes mellitus,  hypertension, hyperlipidemia, who is status post percutaneous coronary  angiography and stenting of the AV groove circumflex in September 2000, with  three stents in the circumflex, and known ostial OM-I disease, with a normal  ejection fraction.  She recently underwent a Cardiolite scan for increasing  shortness of breath, which revealed no significant ischemia.  She was  admitted on April 02, 2002, with mild chest pain.  She did subsequently  rule out for a myocardial infarction.  She is now brought for a cardiac  catheterization, to reassess her coronary status.   DESCRIPTION OF PROCEDURE:  After an informed consent, the patient was  brought to the cardiac catheterization laboratory.  The right groin was  shaved, prepped and draped in the usual sterile fashion.  The ECG monitoring  was established.  Using a modified Seldinger technique, a 6-French arterial  sheath was inserted into the right femoral artery.  The  6-French diagnostic  catheters were then used to perform a diagnostic angiography.   RESULTS:  1. Left main coronary artery:  There was a large left main coronary artery     without disease.  2. Left anterior descending coronary artery:  The left anterior descending     coronary artery is a large vessel which coursed to the apex, with one     diagonal branch.  The LAD has mild 40% mid-vessel narrowing.  The first     diagonal is a medium-sized vessel which bifurcated distally, and has no     significant disease.  3. Left coronary artery:  The left coronary artery and a medium-sized ramus     intermedius with no significant disease.  4. Left circumflex coronary artery:  The left circumflex coronary artery is     a large vessel which coursed in the AV groove and gave off two marginal     branches.  There are three overlapping stents noted in the mid-portion of  the AV groove circumflex, with one OM originating within the mid-portion     of the stent.  There is diffuse 70%-80% in-stent restenosis proximally     and 40% distally.  The first OM is a medium-sized vessel with a 95%     ostial lesion.  The second OM is a large vessel with no significant     disease.  5. Right coronary artery:  The right coronary artery is a medium-sized     vessel which is dominant and gives rise to the PDA as well as the     posterolateral branch.  There is mild 40% narrowing in the mid-portion of     the RCA.  The posterolateral branch had no significant disease.  LEFT VENTRICULOGRAM:  There was an ejection fraction of 50%.  HEMODYNAMICS  Systemic arterial pressure:  147/68.  LV systemic pressure:  150/11.  LVEDP:  20.   INTERVENTIONAL PROCEDURE:  Left circumflex:  Following the diagnostic  angiography a 6-French sheath was then exchanged for a 7-French sheath.  A 7-  [place] guiding catheter with sideholes was then coaxially engaged in the  left coronary  ostium.  Next, a #0.014 short marker was  advanced out of the  guiding catheter into the proximal circumflex and positioned in the distal  second OM without difficulty.  We then attempted to pass a 2.75 mm x 10 mm  cutting balloon into the proximal portion of the left circumflex, and were  unable to get this across into the previously stented segment.  The cutting  balloon was then removed and a 2.5 mm x 15 mm __________  was then  positioned in distal mid-circumflex, and several inflations with the  __________ were then performed to a maximum of six atmospheres throughout  the mid-circumflex.  Followup angiogram revealed no evidence of dissection  or thrombus.  At this point the __________ was removed and a 2.75 mm x 10.0  mm cutting balloon was then tracked into the distal portion of the AV groove  circumflex.  Approximately six inflations were then performed throughout the  distal, mid, and proximal portion of the previously-stented segment to six  atmospheres.  Follow-up angiogram revealed a diffuse thrombus within the  previously-stented segment, extending down into the second OM, and into the  first OM.  The cutting balloon was then removed.  The __________ was then  passed down into the second OM with multiple inflations then performed  throughout the proximal second rim, back through the mid-portion of the AV  groove circumflex.  This did improve flow; however, there was persistent  thrombus throughout the AV groove circumflex and into the first OM.  A  second guide wire #0.014 marker was then inserted into the first OM.  The  __________ was then easily passed into the proximal portion of the first OM,  and several inflations were then performed to a maximum of approximately  four atmospheres.  Follow-up angiogram revealed decreased thrombus burden;  however, there was obvious distal embolization of the thrombus into the distal OM-I and OM-II.  The patient continued to develop recurrent thrombus  throughout the mid-AV  groove circumflex, and we ultimately elected to place  a stent into the proximal portion of the second OM.  We placed a 2.5 mm x  13.0 mm Cypher, deployed to 12 atmospheres.  The balloon was then pulled  back into the overlapping segment of the stent, and inflated to 16  atmospheres for approximately 30 seconds.  A followup angiogram revealed no  evidence of dissection, with improvement of TIMI-2 flow into the second OM.  There continued to be thrombus throughout the proximal portion of the first  OM, and again we were able to dilate the first OM with the __________, with  improvement.  The ACT was confirmed throughout the case, initially at 270 at  10:22, and 303 at 10:44.  She was then maintained on an Angiomax drip.  We  continued to monitor her with multiple doses of intercoronary nitroglycerin,  as well as two separate administrations of 18 mcg of adenosine, with some  improvement.  The patient continued to complain of chest pain, though there  were no obvious electrocardiogram changes, with TIMI-2 to TIMI-3 flow in  this first and second obtuse marginal.  Films were also reviewed by Dr.  Pearletha Furl. Alanda Amass, and we felt that the patient should probably be  discontinued from her Angiomax and switched to Integrilin with a continuous  Integrilin infusion.  The final orthogonal angiograms revealed less than functional restenosis  within the previous AV circumflex, with no evidence of dissection.  There  was no evidence of obvious circumflex within the mid-portion of the AV  groove, though there was abrupt vessel cutoff in the distal portion of the  first and second obtuse marginal, consistent with embolized thrombus.  At  this point we elected to conclude the procedure.  All balloons and catheters  were removed.  A hemostatic sheath was sewn in place.  The patient was transferred back to the TCU in stable condition.   CONCLUSION:  1. A successful cutting balloon angioplasty in the  mid-arteriovenous groove     circumflex using a 2.75 mm x 10.0 mm cutter.  2. A successful percutaneous coronary angiography of the first obtuse     marginal.  3.     The successful placement of a Cypher 2.5 mm x 13.0 mm stent in the proximal      second obtuse marginal.  4. Adjunctive use of Angiomax infusion.  5. Systemic hypertension.                                               Darlin Priestly, M.D.    RHM/MEDQ  D:  04/03/2002  T:  04/03/2002  Job:  [id number]

## 2010-08-07 NOTE — Discharge Summary (Signed)
Brenda [name], Brenda [name]                         ACCOUNT NO.:  [id number]   MEDICAL RECORD NO.:  [id number]                   PATIENT TYPE:  INP   LOCATION:  2302                                 FACILITY:  [facility]   PHYSICIAN:  Dani Gobble, MD                    DATE OF BIRTH:  1945/05/23   DATE OF ADMISSION:  03/20/2003  DATE OF DISCHARGE:  03/22/2003                                 DISCHARGE SUMMARY   DISCHARGE DIAGNOSES:  1. Unstable angina.  2. Coronary artery disease with intervention cutting balloon angioplasty to     the circumflex obtuse marginal.  3. Hyperlipidemia.  4. Hypertension.  5. Diabetes mellitus type 2.  6. Recent sinusitis, treated.  7. Some upper quadrant left abdominal discomfort.  8. Recent back pain.  9. Fibromyalgia.  10.      Peripheral neuropathy.   DISCHARGE CONDITION:  Improved.   PROCEDURES:  Combined left heart catheterization March 21, 2003, by Dr.  Susa Griffins.   On March 21, 2003, cutting balloon angioplasty to the circumflex by Dr.  Susa Griffins.   DISCHARGE MEDICATIONS:  1. Lipitor 40 mg every evening.  2. Foltx one daily.  3. Protonix 40 mg daily.  4. Enteric-coated aspirin 81 mg daily.  5. Plavix 75 mg daily.  6. Lotensin HCT 20/12.5 one twice a day.  7. Caduet 5/10 one every evening.  8. Mobic 15 mg daily.  9. Claritin 10 mg daily.  10.      Atenolol 50 mg daily.  11.      Ferrous sulfate 650 mg one twice a day.  12.      Insulin Lantus insulin 90 units in the morning and 90 units in h.s.  13.      Humalog sliding scale three times a day.  Take insulin as before.  14.      Skelaxin 800 mg two a day.  15.      Nitroglycerin sublingual p.r.n. chest pain.  16.      Nasonex or Flonase spray as before.  17.      Ketek as before.   DISCHARGE INSTRUCTIONS:  1. No strenuous activity, no sexual activity, no lifting over 10 pounds for     three days, then resume regular activity.  2. Low-fat, low-salt diabetic diet.  3.  Wash right groin cath site with soap and water; call if any bleeding,     swelling or drainage.  4. See Dr. Juleen [name] as before.  5. Follow up with Dr. Jenne [name] in two weeks.  Office should call you Monday     for an appointment and day.  If not, call them Monday afternoon.   HISTORY OF PRESENT ILLNESS:  The patient is a 65 year old white female,  patient of Dr. Mikey [name], who was admitted by Dr. Domingo [name] on March 20, 2003, secondary to chest pain.  She has a history of  coronary disease with  stents in 2000 x3, and then in January of 2004, cutting balloon angioplasty  to the mid left circ and Cypher stent to OM2, and then in May of 2004 she  had cutting balloon angioplasty and brachytherapy to angioplasty to the  ostial OM lesion.  Then in October of 2004, she underwent cutting balloon  and DES stent to the ostial proximal OM2 and DES stent to the distal left  circ.  EF was 60%.   The patient had presented to the ER with complaints of chest pain,  occasionally after her last cath, but in December of this year she started  having increased episodes.  She took nitroglycerin daily for p.r.n. chest  pain.  The chest pain has progressed over the last few weeks.  On March 20, 2003, she took two nitroglycerin, she also had associated shortness of  breath, possible nausea, but no diaphoresis with this discomfort.  She also  complained of weakness and leg discomfort and breastbone pain, worse with  exertion, migratory chest pain, also back pain which is chronic.  Pain in  the ER was 8/10, dropped to 6/10 with nitroglycerin.   Recently has been treated with Biaxin for sinusitis prior to admission.   PAST MEDICAL HISTORY:  As stated with iron deficiency anemia, fibromyalgia,  and sinusitis.  Hypertension and hyperlipidemia, diabetes mellitus type 2,  and peripheral neuropathy.   OUTPATIENT MEDS:  Same as current meds except we have added Foltx and  increased the Lipitor to 40 mg daily.    ALLERGIES:  LESCOL causes myalgias, ZOCOR causes vision changes, PENICILLIN,  CODEINE, and SULFA.   FAMILY HISTORY/SOCIAL HISTORY/REVIEW OF SYSTEMS:  See H&P.   PHYSICAL EXAM AT DISCHARGE:  Right groin cath site stable, no hematoma.  HEART:  S1 and S2, regular rate and rhythm.  LUNGS:  Clear.  VITAL SIGNS:  Blood pressure 134/48, pulse 64, respirations 18, temperature  97, room air oxygen saturation 100%.   LABORATORY VALUES:  Hemoglobin 12.3, down to 11.4, hematocrit 34.1,  platelets 123,000; neutrophils 66, lymphs 23, monos 6, eosinophils 5,  basophils 1.  Hemoccult blood was negative.  Pro-time 12.9, INR of 1.0, PTT  28.  Heparin level was therapeutic.   Please note hematocrit and hemoglobin prior to discharge; hemoglobin was  10.5 and hematocrit 31.6, and platelets were down to 133.  Chemistry; sodium  132, potassium 4.3, chloride 103, CO2 23, glucose 196, BUN 21, creatinine 1.  Cardiac enzymes were negative.  Lipids; LDL 90, HDL 57, total cholesterol  167, triglycerides 101.  TSH was 4.163.  C-reactive protein was 1.5.  Homocysteine was 14.68.   Glycohemoglobin was 9.1.  Cardiac enzymes:  CKs ranged 447, 219, 188; MBs  14.3, 7.6, 5.9; troponin I's were all negative.   RADIOLOGY:  Chest x-ray is not currently in the computer.   EKG:  Sinus rhythm, occasional PVCs, left anterior fascicular block, left  ventricular hypertrophy, and some LVH.   Cardiac catheterization on March 21, 2003, with a re-stenosis undergoing  cutting balloon arthrectomy of in-stent re-stenosis of the ostium of the OM2  and the distal recently placed DES stent from December 24, 2002, and kissing  balloon dilatation of the OM2 and mid circumflex.  Unfortunately, Dr.  Alanda Amass felt she was still at risk for re-stenosis.  Options in the future  are limited but may include sandwich stenting and/or crush DES stenting if she has OM2 ostial re-stenosis or stenosis.  She is a potential candidate  for  multivessel bypass grafting, but currently she has noncritical disease  of the LAD and RCA and ____________ was done at this point.   He also recommended vigorous lipid controls, continue aspirin, Plavix and  medical therapy.   HOSPITAL COURSE:  Ms. Clopper was admitted by Dr. Domingo [name] on March 20, 2003, for acute onset of chest pain.  Enzymes and CK-MBs were mildly  elevated.  Troponin I's were negative and she was admitted to CCU.  IV  heparin and IV nitroglycerin and plans were for cardiac catheterization the  next day.  She did undergo cardiac cath by Dr. Alanda Amass on March 21, 2003, with interventions as described.  She tolerated the procedure well and  was sent back to her ICU bed.   On March 22, 2003, she was stable, had not ambulated and continued on  nitroglycerin at 3 mL an hour.  Plans are to discontinue the nitroglycerin,  ambulate and if she remains stable she will be discharged home with changes  in medical regimen as described previously.  She will follow up with both  Dr. Juleen [name] and Dr. Alanda Amass.  Will recheck labs next week to ensure  platelets do not drop any further as should come back up, and also evaluate  her hemoglobin status.      Darcella Gasman. [place], N.P.                     Dani Gobble, MD    LRI/MEDQ  D:  03/22/2003  T:  03/22/2003  Job:  [id number]   cc:   Brooke Bonito, M.D.  [address] [employer] [address]  [place]  [place] [address]  Fax: [phone]   Richard A. Alanda Amass, M.D.  [phone] [address]  [place]  [place] [address]  Fax: [phone]

## 2010-08-07 NOTE — Cardiovascular Report (Signed)
Brenda [name], Brenda [name]                         ACCOUNT NO.:  [id number]   MEDICAL RECORD NO.:  [id number]                   PATIENT TYPE:  INP   LOCATION:  6526                                 FACILITY:  [facility]   PHYSICIAN:  Richard A. Alanda Amass, M.D.          DATE OF BIRTH:  11-Mar-1946   DATE OF PROCEDURE:  12/24/2002  DATE OF DISCHARGE:                              CARDIAC CATHETERIZATION   PROCEDURE:  1. Retrograde central aortic catheterization.  2. Selective coronary angiography pre and post intracoronary nitroglycerin     administration.  3. Left ventricular angiogram RAO/LAO projection.  4. Abdominal angiogram hand injection.  5. Subselective left internal mammary artery hand injection.  6. Weight adjusted heparin.  7. Integrilin double bolus plus infusion.  8. Addition of Plavix 150 mg p.o.  9. Continued aspirin.  10.      Cutting balloon atherectomy high grade restenosis ostium obtuse     marginal 2, subsequent DES stent for focal dissection.  11.      Cutting balloon atherectomy restenosis status post brachytherapy     mid obtuse marginal 3 and subsequent DES stent.   DESCRIPTION OF PROCEDURE:  The patient was brought to second floor CP  Laboratory in the postabsorptive state after 5 mg Valium p.o. pre  medication.  The right groin was prepped/draped in usual manner.  1%  Xylocaine used for local anesthesia.  The CRFA was entered with a single  anterior puncture using an 18 thin wall needle and a 6-French short side-arm  sheath inserted without difficulty.  Diagnostic coronary angiography was  done with a 6-French 4 cm taper preformed Cordis coronary and pigtail  catheters along with subselective LIMA, RIMA with the right coronary  catheter.  LV angiogram was done at 25 mL 14 mL/second RAO and 20 mL/12  mL/second LAO projection.  Pullback pressure of the CA was performed showing  no gradient across the aortic valve.  Abdominal aortic angiogram was done by  hand  injection in the mid stream PA projection above the level of the renal  arteries demonstrating 30 to possibly 40% left renal artery narrowing which  was single and normal right renal artery.  The infrarenal abdominal aorta  immediately below the renals did not have any significant disease on limited  injection and there was no aneurysm seen.   PRESSURES:  LV 155/0.  LVEDP 18 mmHg.   CA:  155/80 mmHg.   There is no gradient between the LV and CA on catheter pullback.   Fluoroscopy demonstrated 2+ calcification of the LAD, circumflex, and right  coronary artery.  The previously placed tandem circumflex stents were  visualized fluoroscopically.   LV angiogram demonstrated a minimal area of hypokinesis in the mid  anterolateral wall.  No other wall motion abnormalities and normal EF of  approximately 60% with no mitral regurgitation present.   The main left coronary artery was normal.   The left anterior  descending artery had minor irregularities in the proximal  third, but no significant stenosis and was essentially relatively smooth  throughout the proximal third and one-half.  After a bend in the junction of  the proximal mid LAD there was some area of LAD kinking and possibly  myocardial bridge with about 60% narrowing and decreased dye density  representing mild progression from the prior angiogram.  The remainder of  the LAD coursed to the apex of the heart.   There was a small DX1 from the proximal third that was normal and moderate  sized DX2 at the level of SP1 from the junction of the proximal mid third  that bifurcated, had no significant stenosis and a small distal diagonal.   The right coronary artery was a dominant vessel.  There was approximately  60% segmental narrowing in the mid portion at the RV branch and another 40%  narrowing beyond the acute margin in the distal RCA.  The PDA and PLA had  irregularities, but no high grade disease.  The PDA bifurcated.  There   appeared to be some mild progression of the mid RCA lesion compared to the  previous study, but good residual lumen and flow.   The circumflex artery had smooth 40-50% narrowing in its ostial to proximal  portion that was mildly segmental.  There was a very small OM 1, had no  significant stenosis.  There was previously placed stents before the OM 2  and just after the OM 2 branch, although we were not able to distinguish  whether it crossed the OM 2.  We know from prior studies that the stents did  not.  Beyond the OM 2 in the circumflex proper at the distal portion of the  previously placed stent and beyond this was 75-80% segmental narrowing with  decreased dye density in the large distal circumflex.  This occurred after  the small PABG branch.   The LIMA and RIMA were widely patent.  There was no brachiocephalic or  subclavian stenosis.   DISCUSSION:  This 65 year old married disabled mother of one with no  grandchildren quit smoking in 2000.  Has insulin-dependent diabetes  mellitus, significant peripheral neuropathy, and significant coronary artery  disease.   She suffered a PMI December 05, 1998 and underwent PTCA and stenting with  triple tandem 2.5 x 13 TriStar and 2.5 x 12 S660 and 2.5 x 9 S660.  She  subsequently developed restenosis and had her second PCI on April 03, 2002  when she had 2.75 cutting balloon dilatation and another 2.5 x 13 Cypher  stent put in with dilatation of the circumflex and OM branch.   She had recurrent symptoms and last PCI #3 Aug 07, 2002 she had double wire  technique and 2.5 mm balloon dilatation of the OM 2 ostia and then 2.75  cutting balloon upgraded to 3.0 cutting balloon circumflex dilatation and  subsequent long segment 3.0/52 Galileo beta brachytherapy treatment.   She is admitted on December 23, 2002 with prolonged chest pain compatible with  ischemia relieved with nitroglycerin.  Myocardial infarction was ruled out with serial  enzymes and EKGs.  She was hyperglycemic with normal renal  function.   Creatinine 1.1.  The patient was hydrated preoperatively and underwent  diagnostic catheterization as outlined above.   It is difficult to tell what the culprit lesion is in this setting but I  think it may be restenosis of the OM 2 ostium and/or progression of disease  and  restenosis beyond the mid circumflex stents.  It is recommended that she  undergo culprit lesion PCI.   She received 4500 units of heparin, given double bolus Integrilin with ACTs  monitored.  She was given 2 mg of Nubane and 3 mg of Versed for sedation  during the procedure.   A double wire technique was used with two Asahi 0.014 inch soft guidewires.  The diagnostic angiography was done with a 3.5 diagnostic left coronary and  the intervention was done with a 6-French 3.5 left guide was used.   The OM 2 was wired with the first Asahi wire and the distal circumflex (OM  3) was wired with the second Asahi soft guidewire.  The OM 2 ostia was then  crossed with a 2.5 x 6 cutting balloon and dilated at 6/60.  It was then  post dilated with a 2.75 Voyager balloon at 6/48.  The balloon was pulled  back and there was obvious focal dissection at the ostia but there remained  good TIMI 3 flow.  We addressed this later.   The distal circumflex in the distal portion of the stent just beyond it was  dilated with a 2.5 x 10 cutting balloon at 4/29 and 4/23.   The distal circumflex was then stented with a 2.5 x 18 Cypher stent which  was deployed at 8/35 and then the overlap was dilated at 14/32.   Then addressed the OM 2 again and this was stented with a 2.5 x 8 Cypher  stent deployed at 10/46, post dilated at 13/44.   Final angiograms post IC nitroglycerin administration revealed excellent  result in the distal circumflex with stenosis reduction of 75-80% to 0%.   The OM 2 still had residual 20-30% at the ostia since the stent was probably  1 mm  short beyond the ostia but there was no dissection visible and the  proximal portion of the OM 2 had 0%.  There was good TIMI 3 flow through the  OM 2 and no compromise of the circumflex proper so it was elected to not try  to dilate any further.  Both guidewires were easily removed.  The final ACT  was 325 seconds.  The sidearm sheath was flushed, secured to the skin and  the patient taken to the holding area.  She had chest pain with inflations  that were promptly relieved with balloon deflation and generally tolerated  the procedure well.  We will continue Integrilin for 18 hours, aspirin and  Plavix, and medical therapy of her associated problems.  She still is at  risk of restenosis of the ostium of the OM 2 and for progression of disease  in her proximal mid LAD and mid right coronary which are both of borderline  significance at present.  The patient may eventually require further  intervention and/or CABG.   CATHETERIZATION DIAGNOSES: 1. Arteriosclerotic heart disease status post circumflex percutaneous     transluminal coronary angioplasty (PTCA) #1 with triple tandem stents,     acute PMI December 04, 2000.  2. Status post complex circumflex recutting balloon atherectomy and DES     stenting April 03, 2002.  3. Percutaneous transluminal coronary angioplasty (PTCA) #3 second     restenosis circumflex and obtuse marginal 2 ostium Aug 07, 2002 treated     with cutting balloon atherectomy, double wire technique, and Galileo     brachytherapy.  4. Unstable angina with restenosis distal circumflex beyond previously     placed stents  in distal brachytherapy site and ostium obtuse marginal 2     treated with cutting balloon atherectomy and DES stenting ostial and     proximal obtuse marginal 2 and DES stenting mid distal circumflex as     outlined above.  5. Well preserved left ventricular function.  6. Insulin-dependent diabetes mellitus, diabetic neuropathy.  7.  Hyperlipidemia.  8. Systemic hypertension 30-40% left renal artery stenosis.  9. Fibromyalgia, disabled.                                               Richard A. Alanda Amass, M.D.    RAW/MEDQ  D:  12/24/2002  T:  12/25/2002  Job:  [id number]   cc:   Brooke Bonito, M.D.  [address]  [place]  [place] [address]  Fax: [phone]   Georgiana Spinner, M.D.  [address]  [place]  [place] [address]  Fax: [phone]   Areatha Keas, M.D.  [address]  [address]  [place]  [place] [address]  Fax: [phone]

## 2010-08-07 NOTE — Cardiovascular Report (Signed)
NAMEASHLEEN, Brenda [name]                         ACCOUNT NO.:  [id number]   MEDICAL RECORD NO.:  [id number]                   PATIENT TYPE:  INP   LOCATION:  2302                                 FACILITY:  [facility]   PHYSICIAN:  Richard A. Alanda Amass, M.D.          DATE OF BIRTH:  [birth year]   DATE OF PROCEDURE:  03/21/2003  DATE OF DISCHARGE:                              CARDIAC CATHETERIZATION   PROCEDURE:  1. Retrograde central aortic catheterization.  2. Selective coronary angiography pre and post intracoronary nitroglycerin     administration.  3. Left ventricular angiogram RAO/LAO projection.  4. Subselective left internal mammary artery/right internal mammary artery.  5. Weight adjusted heparin.  6. Plavix 150 mg loading extra.  7. Aggrastat bolus plus infusion.  8. Complex intervention with double wire technique.  9. Circumflex obtuse marginal 2 cutting balloon atherectomy, subsequent     upgraded balloon dilatation for in-stent restenosis ostial obtuse     marginal 2 stenosis.  10.      Distal, mid circumflex in-stent restenosis treated with cutting     balloon atherectomy, subsequent upgraded balloon dilatation, kissing     balloon dilatation circumflex obtuse marginal 2, circumflex mid with     double wire technique.   INDICATIONS:  This 65 year old married mother of one with no grandchildren,  prior smoker, history of AODM, peripheral neuropathy, significant coronary  disease with multiple remote circumflex marginal interventions,  hyperlipidemia was admitted with recurrent chest pain  and probably  _____________ serial enzymes and EKGs.  Her last intervention was December 24, 2002.   The patient suffered a PMI.  Required triple tandem stenting of her  circumflex December 05, 1998 with 2.5 bare metal stents.   She developed restenosis and her second PCI was April 03, 2002 with  cutting balloon dilatation and 2.5 x 13 Cypher stent in the CX and balloon  dilatation of  the side branch OM2.   She had recurrent symptoms and PCA #3 was Aug 07, 2002 using double wire  technique.  She had 2.5 mm balloon dilatation, then 2.75 cutting balloon of  the CX OM, 3.0 cutting balloon of the CX mid, and brachytherapy with a  Galileo beta brachytherapy system 3.0 x 52 of the circumflex.   Her last intervention for recurrent ischemia was December 24, 2002.  At that  time she had cutting balloon dilatation of the CX OM2 ostia and subsequent  2.5 x 8 Cypher stenting.  She had Cypher stenting beyond her previously  placed stents of the mid/distal circumflex with a 2.5 x 18.  She is being  studied now for recurrent angina as outlined above.   DESCRIPTION OF PROCEDURE:  She was hydrated preoperatively.  Has been on  aspirin and Plavix.  She reports to second floor CP laboratory in  postabsorptive state.  5 mg of Valium was given as premedication.  She was  given intermittent Nubain 2 mg and Versed  4 mg during the procedure.  The  CRFA was entered with a single anterior puncture using 18 thin wall needle  and a 6-French short Dake sidearm sheath were inserted without difficulty.  Diagnostic coronary angiography was done with 6-French 4 cm taper, Cordis  coronary and pigtail catheters using Omnipaque dye throughout the procedure.  IC nitroglycerin was administered during the diagnostic and subsequent  interventional procedure for a total of 800 mcg.  LV angiogram was done in  the RAO and LAO projection at 25 mL 14 mL/second, 20 mL 12 mL/second through  a pigtail catheter.  Subselective LIMA and RIMA with the right coronary  catheter by hand injection showed a patent RIMA and brachiocephalic, a  patent LCSA, and LIMA.  The left vertebral had approximately 40-50%  narrowing in its ostial and proximal portion, but good antegrade flow.   LV angiogram demonstrated minimal hypokinesis and late systolic relaxation  of the posteroapical segment and mid anterolateral wall.  EF,  however, was  approximately 55%.  There was trace mitral regurgitation with PVCs but no MR  with sinus beats.  Angiographic LVH was present.   PRESSURES:  LV 160/0, LVEDP 18 mmHg.   CA 150/85 mmHg.   _____________ across the aortic valve on catheter pullback ___________  fluoroscopy toward the circumflex and OM2 stents.  There was 1-2+ coronary  calcification present.  No intracardiac or valvular calcification.   The main left coronary was normal.   The LAD had 40-50% relatively smooth narrowing between a large trifurcating  DX1 and a moderate sized DX2 at the junction of proximal mid third of the  LAD across the apex of the heart and bifurcated.  The remainder of the LAD  had no significant stenosis.  There was good residual lumen in the  proximal/mid LAD lesion and this was unchanged.   The right coronary was a dominant vessel with a 50-60% narrowing before the  acute marginal, 40% beyond it in the mid RCA.  Remainder of the vessel had  no significant stenosis with a good PDA and PLA and this was unchanged from  prior angiogram.   The CX OM1 had no significant stenosis.  The proximal circumflex had 40%  smooth narrowing.   The CX OM2 was stented up to the CX mid without a gap.  There was, however,  90-95% concentric stenosis at the ostia of the OM2 within the stent.  In the  CX mid just beyond this there was haziness, but no significant ISR.  In the  last placed Cypher stent in the mid/distal circumflex there was 85% in-stent  restenosis fairly diffusely within the stent.   It was elected to proceed with intervention in this setting with noncritical  right and LAD disease.  The patient was given 150 of Plavix (she had been on  Plavix and aspirin as an outpatient).  Aggrastat bolus 20 mcg/kg plus drip  was administered.  Weight adjusted heparin of 4300 units and ACTs were  monitored.  Double wire technique was used with a JL3.5 6-French Cordis guiding  catheter.  Asahi light  0.014 inch guidewires were used to cross the  circumflex to the distal vessel and cross into the OM2 across the ostial  stenosis.  Initially, the distal circumflex stent was dilated with a 2.5  cutting balloon 7/30, 12/43, and 10/43.  Further dilatation was done at  14/20 and then the mid area beyond the OM2 was dilated at 12/28 later in the  procedure.  The CX OM2 was crossed with a 2.0 x 6 cutting balloon and the ostium was  dilated at 12/57 and 14/34.  Balloon was upgraded to a 2.5 x 10 using  standard exchange technique and dilatation was done at 12/42 and 13/30.   There was still elastic recoil and significant stenosis of the ostium of the  OM2.  This was then redilated with a 2.75 x 8 Quantum Maverick balloon at  17/52 and 20/41.  The ostium then opened nicely with a good acute result  without dissection.  There was, however, a plaque shift to the mid  circumflex within the stent.  For this reason we did kissing balloon  technique.  The circumflex was dilated with a 2.75 x 8 balloon distally at  14/81.  This was then removed.  A 2.5 x 9 Quantum Maverick was positioned in  the ostium of the OM2 and a 2.75 x 8 in a kissing balloon fashion.  Simultaneous inflations were done at 5/45 for each and then sequentially  8/40 and 5/30 and kissing balloon at 8/30.  The balloons were then removed,  IC nitroglycerin administered and final injections were performed.   The CX OM2 ostia was reduced from 95% to less than 20% with good  angiographic result, no dissection, and good flow to the bifurcating OM2.   The mid CX beyond the OM2 had less than 20% stenosis and the in-stent  restenosis of the distal circumflex stent was reduced to less than 20%.  There was good flow to the distal circumflex and the PABG branch.   The ostium of the circumflex had 40% smooth narrowing that was unchanged.   This patient's history is complex as outlined above.  She has had successful  cutting balloon  atherectomy of in-stent restenosis of the ostium of the OM2  and the distal recently placed DES stent (December 24, 2002).  She has also  had kissing balloon dilatation of the OM2 and mid circumflex.   Unfortunately, she is still at risk of restenosis again.  Options are  becoming limited and might include sandwich stenting and/or crush DES  stenting if she has OM2 ostial stenosis.  She is a potential candidate for  multivessel CABG.  However, she, at present, has noncritical disease of the  LAD and RCA which is the reason prompting reintervention at this time.   Recommend more vigorous lipid control, continued aspirin and Plavix, and  medical therapy.  She is a remote smoker and quit in 2000.   CATHETERIZATION DIAGNOSES:  1. Unstable angina.  2. Insulin-dependent diabetes mellitus.  3. Hyperlipidemia.  4. Remote smoker.  5. Multiple past PCIs and interventions as outlined above.  Please see    December 24, 2002 catheterization report for further details.  6. Fibromyalgia.  7. Diabetic peripheral neuropathy.  8. Gastroesophageal reflux disease.                                               Richard A. Alanda Amass, M.D.    RAW/MEDQ  D:  03/21/2003  T:  03/22/2003  Job:  [id number]   cc:   Darlin Priestly, M.D.  [phone] [address], [address]  [place]  [place] [address]  Fax: [phone]   Brooke Bonito, M.D.  [address]  [place]  [place] [address]  Fax: [phone]   Greggory Stallion  Hazeline Junker, M.D.  [address]  [place]  [place] [address]  Fax: [phone]   Areatha Keas, M.D.  [address]  [address]  [place]  [place] [address]  Fax: [phone]

## 2010-08-07 NOTE — Discharge Summary (Signed)
[name], POUNDS                         ACCOUNT NO.:  [id number]   MEDICAL RECORD NO.:  [id number]                   PATIENT TYPE:  INP   LOCATION:  6526                                 FACILITY:  [facility]   PHYSICIAN:  Darlin Priestly, M.D.             DATE OF BIRTH:  10/17/1945   DATE OF ADMISSION:  12/23/2002  DATE OF DISCHARGE:  12/26/2002                                 DISCHARGE SUMMARY   DISCHARGE DIAGNOSES:  1. Unstable angina pectoris, ruled out for myocardial infarction.  2. Known coronary artery disease status post cardiac catheterization with     intervention this admission.  She had two stents placed to branches of     circumflex artery.  3. Insulin-dependent diabetes mellitus.  4. Hypertension.  5. Fibromyalgia.  6. Peripheral neuropathy.  7. Hyperlipidemia.  8. Sedentary lifestyle.   </HISTORY OF PRESENT ILLNESS AND   HOSPITAL COURSE:  Brenda [name] is a 66 year old patient of Dr. Jenne Campus with  prior history of coronary artery disease and stenting with intervention.  The patient presented to the emergency room with complaint of several-month  history of chest pain which became worse during the last couple of weeks.  The most severe episode was on Friday.  At that time, the patient presented  to the emergency room.  She was started on IV heparin and nitroglycerin and  admitted to the telemetry unit on rule-out-MI protocol.   Enzymes were negative x 3, and EKG did not show any ST-T wave changes.  The  patient was ruled out by these.  The patient was ruled out for myocardial  infarction, but knowing he prior history of coronary artery disease, it  seemed prudent to proceed with coronary angiography.   Hospital procedure: Coronary angiography was performed on December 24, 2002,  by Dr. Alanda Amass.  Severe blockages in the branches of the circumflex  artery.  Cutting balloon atherectomy for high-grade restenosis of obtuse  marginal branch  #2 with subsequent  drug-eluting stent performed.  Also  restenosis in prior therapy side at the mid obtuse marginal #3.  Cutting  balloon atherectomy was performed with insertion of drug-eluting stent also.  The patient tolerated the procedure well but developed severe nausea after  and was started on Zofran and Reglan IV.   She had postprocedure Integrilin drip for 18 hours.  Groin was stable with  no ecchymosis, no hematoma.   Later on the day,  patient tolerated cardiac rehab x 2 but said was very  weak after that.  Next morning was found to be in improved condition.  Did  not complain of chest pain or shortness of breath.  Did not have any nausea  and deemed stable for discharge home.   LABORATORY DATA:  Hospital labs and pertinent studies included CBC which  showed white blood cell count 6.9, hemoglobin 10.5, hematocrit 31.8,  platelet count 128.  Hepatic function panel was normal.  BMP showed sodium  133, potassium 4.3, chloride 102, CO2 24, glucose 218, BUN 18, creatinine  1.1, calcium 8.2.   DISCHARGE MEDICATIONS:  1. Lotensin 20 mg b.i.d.  2. HCTZ 12.5 mg b.i.d.  3. Skelaxin 400 mg 1 to 2 pills t.i.d.  4. Prevacid 30 mg 2 capsules b.i.d.  5. Mobic 15 daily.  6. Tenormin 50 mg daily.  7. Plavix 75 mg daily.  8. Aspirin 81 mg daily.  9. Lipitor 20 mg daily.  10.      Amitriptyline 25 mg daily.   ACTIVITY:  No driving, no heavy lifting greater than 5 pounds, no strenuous  activity x 3 days.   DIET:  Low-salt, low-carbohydrate, and low-cholesterol diet.   FOLLOW UP:  She will see Dr. Jenne Campus on January 15, 2003, at 2 p.m.      Raymon Mutton, P.A.                    Darlin Priestly, M.D.    MK/MEDQ  D:  12/26/2002  T:  12/26/2002  Job:  [id number]

## 2010-08-07 NOTE — Discharge Summary (Signed)
Brenda [name], Brenda [name]                         ACCOUNT NO.:  [id number]   MEDICAL RECORD NO.:  [id number]                   PATIENT TYPE:  INP   LOCATION:  2011                                 FACILITY:  [facility]   PHYSICIAN:  Darlin Priestly, M.D.             DATE OF BIRTH:  1945-03-29   DATE OF ADMISSION:  04/02/2002  DATE OF DISCHARGE:  04/06/2002                                 DISCHARGE SUMMARY   ADMISSION DIAGNOSES:  1. Unstable angina.  2. Coronary artery disease.     a. History of three stents placed in 2000.     b. Last catheterization, September 2000, with a totalled circumflex with        stents x3; as well, there was percutaneous transluminal angioplasty to        the first obtuse marginal.  There was residual 60% left anterior        descending and tandem 50% and 40%.  Ejection fraction of 60%.     c. Status post negative Cardiolite, March 12, 2000.  3. Hypertension.  4. Hyperlipidemia.     a. History of muscle aches with Lescol.     b. Questionable visual disturbances/muscle aches with Zocor.  5. Non-insulin-dependent diabetes mellitus.  6. Recent sinus infection.  7. History of cholecystectomy.  8. History of remote tobacco use.  9. Recent iron deficiency anemia.   DISCHARGE DIAGNOSES:  1. Unstable angina.  2. Coronary artery disease.     a. History of three stents placed in 2000.     b. Last catheterization, September 2000, with a totalled circumflex with        stents x3; as well, there was percutaneous transluminal angioplasty        to the first obtuse marginal.  There was residual 60% left anterior        descending and tandem 50% and 40%.  Ejection fraction of 60%.     c. Status post negative Cardiolite, March 12, 2000.  3. Hypertension.  4. Hyperlipidemia.     a. History of muscle aches with Lescol.     b. Questionable visual disturbances/muscle aches with Zocor.  5. Non-insulin-dependent diabetes mellitus.  6. Recent sinus infection.  7.  History of cholecystectomy.  8. History of remote tobacco use.  9. Recent iron deficiency anemia.  10.      Status post cardiac catheterization on April 03, 2002 by Dr.     Darlin Priestly.  He performed percutaneous transluminal coronary     angioplasty and cutting balloon angioplasty to the mid left circumflex;     as well, there was percutaneous transluminal coronary angioplasty to the     proximal first obtuse marginal; also, there was percutaneous transluminal     coronary angioplasty and stent using a CYPHER stent to the first obtuse     marginal.  Ejection fraction was 50%.  The patient tolerated the  procedure well without complication.   HISTORY OF PRESENT ILLNESS:  The patient is a 65 year old white married  female with a history of CAD with history of stenting x3 back in 2000.  She  had normal LV function in the past.   Beginning on Friday, she developed midsternal chest pain with radiation to  the back and arms and mild diaphoresis.  She could not do any activity  without becoming short of breath.  She did not do much activity over the  weekend, then on that Monday, she had recurrent chest pain and took three  nitroglycerin with some improvement.  She came to the emergency room and IV  nitroglycerin provided complete relief.  She noted being short of breath off  and on for several months.  She recently had been treated for sinusitis.   On exam, she was stable.  Blood pressure was elevated at 176/94, heart rate  77, 94% on room air.  No significant abnormalities on physical exam.  Initial labs were stable.  EKG showed sinus rhythm without acute change.   At this time, she was seen and evaluated by Dr. Lenise Herald.  It was felt  that she was experiencing unstable angina.  We planned for admission to  telemetry and checked serial enzymes to rule out MI.  She was continued on  IV heparin and IV nitroglycerin and was planned for repeat catheterization.   HOSPITAL  COURSE:  On April 03, 2002, the patient underwent cardiac  catheterization by Dr. Lenise Herald.  He performed PTCA and cutting  balloon angioplasty to the mid left circumflex; as well, he performed PTCA  to the proximal OM-1.  He performed PTCA and stent using a CYPHER stent to  the OM-2.  EF was 50%.  He planned to restart Integrilin and to continue for  24 hours post procedure.  We would check further enzymes, continue IV  nitroglycerin and monitor in TCU.  She tolerated the procedure well without  complications.   On April 04, 2002, the patient had complaints of nausea, vomiting and  headache.  Labs remained stable.  Groin site remained stable.  At that time,  we planned to check a KUB to evaluate her abdomen, continue with clear  liquids at that time.   Followup KUB showed no significant findings.   As well, we had Dr. Lacretia Nicks. Candelaria Stagers follow her up for her diabetes, which was  uncontrolled.  He adjusted her diabetic therapy.   On April 05, 2002, she was evaluated by Dr. Jenne Campus, who felt that we  should monitor her for one more day to assure that she remained stable.  At  that point, she was stable.  It was felt that we would monitor for one more  day.   On April 06, 2002, the patient remained stable.  She has had no further  nausea, vomiting or headache.  She has ambulated without any difficulty.  She denies any further chest pain or shortness of breath.  At this point,  she is afebrile at 97.3, pulse 67, blood pressure 130/60.  Labs are stable  post procedure.  She has maintained sinus rhythm without any arrhythmia.  Right groin site is well-healed without hematoma or bleed.   At this time, she is seen and evaluated by Dr. Cristy Hilts. Ganji, who deems her  stable for discharge home.   HOSPITAL CONSULTS:  Dr. Juleen China did follow the patient post procedure for  management of diabetes mellitus.   HOSPITAL PROCEDURES:  Cardiac catheterization on April 03, 2002 by Dr. Lenise Herald; please see his dictated report for details of the procedure  as it is lengthy; as well, the dictated report is not available to me at  this time.  He performed successful percutaneous transluminal coronary  angioplasty using cutting balloon angioplasty to the mid left circumflex; as  well, he performed percutaneous transluminal coronary angioplasty to the  proximal first obtuse marginal; also, he performed percutaneous transluminal  coronary angioplasty and stent using a CYPHER to the second obtuse marginal  vessel.  He restarted Integrelin for 24 hours post procedure, continued  intravenous nitroglycerin and checked enzymes.  The patient tolerated the  procedure well and had no complications.   LABORATORY DATA:  Iron is low at 33, TIBC 352, percent saturation low at 9,  B12 normal at 257, folate 11.9, ferritin 38.  TSH is normal at 3.967.  Lipid  profile showed total cholesterol 140, triglycerides 227, HDL 36, LDL 59.  Cardiac enzymes at admission showed CK of 99, 81, 106 and 136.  CK-MBs were  4.7, 2.8, 2.1 and 9.4.  Troponin I is 0.01, 0.01, 0.01 and 0.82.  Those  elevated enzymes were on April 04, 2002, post procedure.  Initial  metabolic profile showed sodium 139, potassium 4.3, glucose 124, BUN 16,  creatinine 0.9.  Liver function tests were stable.  On admission, PT 12.7,  INR 0.9, PTT elevated with heparin.  On admission, white count 14.2,  hemoglobin 11.2, hematocrit 33.5 and platelets 108,000.  On discharge, white  count 7.6, hemoglobin 10.3, hematocrit 31.0, platelets 129,000; platelets  are improved.   Chest x-ray shows no active disease.   Abdominal plate showed no significant abnormality, no acute abdomen or  abdominal findings.   EKG shows normal sinus rhythm, 69 beats per minute, nonspecific ST-T  changes.   DISCHARGE MEDICATIONS:  1. Plavix 75 mg once a day for three months.  2. Enteric-coated aspirin 325 mg once a day.  3. Lipitor 10 mg once a day.  4.  Stop taking Zocor.  5. Amitriptyline 10 mg as needed for neuropathy.  6. Lotensin/HCT 20/12.5 twice a day.  7. Mobic 15 mg a day.  8. Atenolol 25 mg a day.  9. Claritin 10 mg a day.  10.      Prevacid 20 mg a day.  11.      Nasacort spray.  12.      Nitroglycerin as directed for chest pain.  13.      Tequin 400 mg once a day for six more days.  14.      Stop taking oral diabetes medicines.  She is to take Lantus 50     units twice a day.  15.      She is giving sliding scale of Humalog per Dr. Juleen China.  She was     instructed in the sliding scale.   ACTIVITY:  No strenuous activity, lifting greater than 5 pounds, driving or  sexual activity for three days.   DIET:  Diabetic, low-salt, low-cholesterol diet.   WOUND CARE:  May gently wash the groin site with warm water and soap.   Call [phone] for any bleeding or drainage is present in the groin site.    FOLLOWUP:  See Dr. Jenne Campus, Monday, January 26th, at 3:30 p.m. and also she was given Dr. Marylen Ponto phone number and was to see him next week.      Mary B. Easley, P.A.-C.  Darlin Priestly, M.D.    MBE/MEDQ  D:  04/06/2002  T:  04/07/2002  Job:  [id number]   cc:   Darlin Priestly, M.D.  [address], [address]  [place]  [place] [address]  Fax: [phone]   Brooke Bonito, M.D.  [address]  [place]  [place] [address]  Fax: [phone]

## 2010-08-07 NOTE — Discharge Summary (Signed)
[name], [name]                         ACCOUNT NO.:  [id number]   MEDICAL RECORD NO.:  [id number]                   PATIENT TYPE:  OIB   LOCATION:  6531                                 FACILITY:  [facility]   PHYSICIAN:  Darlin Priestly, M.D.             DATE OF BIRTH:  [birth year]   DATE OF ADMISSION:  08/07/2002  DATE OF DISCHARGE:  08/08/2002                                 DISCHARGE SUMMARY   DISCHARGE DIAGNOSES:  1. Unstable angina, catheterization this admission with subsequent cutting     balloon and brachytherapy in circumflex and percutaneous transluminal     coronary angioplasty of the first obtuse marginal.  2. Coronary disease with prior circumflex intervention September 2000 and     again January 2004 with stenting to the second obtuse marginal also at     that time after normal Cardiolite study.  3. Insulin-dependent diabetes.  4. Fibromyalgia with significant symptoms.  5. Treated hyperlipidemia.  6. Controlled hypertension.   HOSPITAL COURSE:  The patient is a 65 year old female followed by Dr. Juleen China  and Dr. Jenne Campus.  She has a history of coronary disease.  She had a  catheterization in September 2000 and a stent was placed to the circumflex  at that time.  She had recurrent pain in January 2004.  She had had a  Cardiolite study which was essentially normal in December 2002.  She  underwent catheterization in January with 70% circumflex, 90% OM2.  The  circumflex was treated with a cutting balloon and the OM2 was stented.  She  was seen in the office Jul 31, 2002.  She continues to have chest pain which  radiates to her back and she thinks nitroglycerin may help.  Her symptoms  are complicated by history of fibromyalgia.  She was admitted for diagnostic  catheterization, as we felt Cardiolite study would not be helpful.  Catheterization was done on an elective basis Aug 07, 2002 by Dr. Jenne Campus.  This revealed a 60% mid RCA, a 90% in-stent restenosis in the  circumflex, a  70% proximal circumflex, a 99% proximal OM1, and a patent Cypher stent in  the OM2.  She underwent cutting balloon and brachytherapy to the circumflex  and a PTCA to the OM1.  She was put on Integrilin postprocedure.  This was  discontinued after 12 hours because of a drop in her platelet count to 108.  She tolerated this well.  We feel she can be discharged Aug 08, 2002.  Dr.  Jenne Campus has recommended aspirin and Plavix for at least six months.   DISCHARGE MEDICATIONS:  1. Two baby aspirin once a day.  2. Plavix 75 mg a day.  3. Lantus 80 units twice a day as directed.  4. Lipitor 10 mg a day.  5. Mobic 15 mg a day.  6. Prevacid 30 mg a day.  7. Skelaxin as taken at home.  8. Claritin  10 mg a day.  9. Lotensin/hydrochlorothiazide 20/12.5 once a day.  10.      Atenolol 25 mg a day.  11.      Nitroglycerin sublingual p.r.n.  12.      Elavil as taken at home.   LABORATORY DATA:  Sodium 134, potassium 4.4, BUN 24, creatinine 1.3.  White  count 6.6, hemoglobin 10.2, hematocrit 29.2, platelet count 116.   DISPOSITION:  The patient is discharged in stable condition.  She will  follow up with Dr. Jenne Campus June 4 at 11:45 a.m.     Abelino Derrick, P.A.                      Darlin Priestly, M.D.    Lenard Lance  D:  08/08/2002  T:  08/08/2002  Job:  [id number]   cc:   Brooke Bonito, M.D.  [address]  [place]  [place] [address]  Fax: [phone]

## 2010-08-07 NOTE — Op Note (Signed)
NAMEOREAN, GIARRATANO               ACCOUNT NO.:  [id number]   MEDICAL RECORD NO.:  [id number]          PATIENT TYPE:  INP   LOCATION:  3708                         FACILITY:  [facility]   PHYSICIAN:  Balinda Quails, M.D.    DATE OF BIRTH:  April 22, 1945   DATE OF PROCEDURE:  07/01/2006  DATE OF DISCHARGE:                               OPERATIVE REPORT   SURGEON:  Denman George, MD   ASSISTANT:  Nurse.   ANESTHETIC:  Left ankle block with MAC.   ANESTHESIOLOGIST:  Judie Petit, M.D.   PREOPERATIVE DIAGNOSIS:  Osteomyelitis, left great toe.   POSTOPERATIVE DIAGNOSIS:   PROCEDURE:  Left great toe amputation.   CLINICAL NOTE:  [name] is a 65 year old diabetic female admitted  with a cellulitis of the left foot.  Administered intravenous  antibiotics.  Underwent plain x-rays and MRI revealing an osteomyelitis  of the a left great toe.  Brought to the operating at this time for left  great to amputation.  She has excellent pulses in her left foot.   OPERATIVE PROCEDURE:  The patient brought to the operating room in  stable condition.  Placed in supine position.  Left foot prepped and  draped in sterile fashion.  A left ankle block administered.  MAC  sedation administered.   A circumferential skin incision made at the base of the left great toe.  Dissection carried down through the subcutaneous tissue.  The the first  metatarsophalangeal joint was entered.  The left great toe amputated  through the first MTP joint.  The first metatarsal head appeared quite  normal.   The wound irrigated with saline solution.  Subcutaneous tissue closed  with interrupted 3-0 Vicryl suture.  Skin closed with interrupted 4-0  nylon suture.  Sterile dressings applied.   The patient tolerated the procedure well.  No apparent complications.      Balinda Quails, M.D.  Electronically Signed     PGH/MEDQ  D:  07/01/2006  T:  07/01/2006  Job:  [id number]

## 2010-08-07 NOTE — Procedures (Signed)
[facility]  Patient:    Brenda [name], Brenda [name]                      MRN: [id number] Adm. Date:  [id number] Attending:  Sabino Gasser                           Procedure Report  PROCEDURE:  Colonoscopy.  INDICATIONS:  Ms. Boyar is undergoing colonoscopy because of diarrhea and rectal bleeding.  ANESTHESIA:  Demerol 60 mg, Versed 5 mg.  DESCRIPTION OF PROCEDURE:  With the patient mildly sedated in the left lateral decubitus position, the Olympus videoscopic colonoscope was inserted in the rectum and passed through a tortuous sigmoid colon to the cecum.  Cecum identified by ileocecal valve, appendiceal orifice, both of which were photographed.  We entered into the terminal ileum, which appeared normal.  Of note, there was bright red blood in streaks seen throughout the colon, the etiology of which is unclear.  Did not see any AVMs when I washed these areas. From this point, the colonoscope was then slowly withdrawn, taking circumferential views of the entire colonic mucosa, which generally appeared normal.  Random biopsies taken.  This was done until we reached the rectum, which appeared normal on direct view and showed an internal hemorrhoid on retroflexed view.  Endoscope was straightened and withdrawn.  The patients vital signs and pulse oximetry remained stable.  The patient tolerated the procedure well without apparent complication.  FINDINGS:  Blood in the colon, etiology unclear, bright-red, but no lesions seen other than a hemorrhoid, all the way to the terminal ileum.  PLAN:  Await random biopsy reports and will have the patient follow up with me as an outpatient.  Consider a small bowel series to evaluate further. DD:  01/11/00 TD:  01/11/00 Job: [id number]

## 2010-08-07 NOTE — Op Note (Signed)
NAMEMARCHA, Brenda [name]                         ACCOUNT NO.:  [id number]   MEDICAL RECORD NO.:  [id number]                   PATIENT TYPE:  INP   LOCATION:  2022                                 FACILITY:  [facility]   PHYSICIAN:  Salvatore Decent. Dorris Fetch, M.D.         DATE OF BIRTH:  06/30/1945   DATE OF PROCEDURE:  07/08/2003  DATE OF DISCHARGE:                                 OPERATIVE REPORT   PREOPERATIVE DIAGNOSIS:  Unstable angina with three-vessel coronary disease.   POSTOPERATIVE DIAGNOSIS:  Unstable angina with three-vessel coronary  disease.   PROCEDURES:  1. Median sternotomy.  2. Extracorporeal circulation.  3. Coronary artery bypass grafting x4 (left internal mammary artery to left     anterior descending coronary artery, saphenous vein graft to distal right     coronary, sequential saphenous vein graft to obtuse marginal 1 and 2).  4. Endoscopic vein harvest, right thigh.   SURGEON:  Salvatore Decent. Dorris Fetch, M.D.   ASSISTANTEber Jones A. Eustaquio Boyden.   ANESTHESIA:  General.   FINDINGS:  Good-quality targets except OM-2, fair-quality 1 mm vessel.  Saphenous vein and mammary artery of good quality.   CLINICAL NOTE:  Brenda [name] is a 65 year old female with multiple cardiac risk  factors.  She has a history of coronary disease dating back a year.  She has  had multiple interventions on her circumflex system, both obtuse marginal 1  and 2, with multiple previous angioplasties and stentings.  She now presents  with recurrent unstable angina.  At catheterization she had three-vessel  disease with a 50% ostial left main.  There was near-total in-stent  reocclusion of OM-2.  There was significant stenosis at the takeoff of OM-1.  There was approximately a 70% stenosis in the right coronary.  She was  referred for coronary artery bypass grafting.  The indications, risks,  benefits, and alternatives were discussed in detail with the patient.  She  understood and accepted the risks  and agreed to proceed.   OPERATIVE NOTE:  Brenda [name] was brought to the preop holding area on July 08, 2003.  Lines were placed to monitor arterial, central venous, and  pulmonary arterial pressure.  EKG leads were placed for continuous  telemetry.  The patient was taken to the operating room, anesthetized, and  intubated.  A Foley catheter was placed.  Intravenous antibiotics were  administered.  The chest, abdomen, and legs were prepped and draped in the  usual fashion.   An incision was made in the medial aspect of the right leg, and the greater  saphenous vein was harvested endoscopically from the right thigh.  It was of  good quality.  Simultaneously a median sternotomy was performed.  There was  extensive subcutaneous fat.  The sternotomy was performed.  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  The left internal mammary artery was taken down in the  standard fashion.  It was a good-quality vessel.  The patient was fully  heparinized prior to dividing the distal end of the mammary artery.  There  was excellent flow through the cut end of the vessel.   The pericardium was opened.  The ascending aorta was inspected and palpated.  There was no palpable atherosclerotic disease.  The aorta was cannulated via  concentric 2-0 Ethibond pledgeted pursestring sutures.  A dual-stage venous  cannula was placed via pursestring suture in the right atrial appendage.  Cardiopulmonary bypass was instituted and the patient was cooled to 32  degrees Celsius.  The coronary arteries were inspected and anastomotic sites  were chosen.  The conduits were inspected and cut to length.  A foam pad was  placed in the pericardium to protect the left phrenic nerve and insulate the  heart.  A temperature probe was placed in the myocardial septum and a  cardioplegia cannula was placed in the ascending aorta.   The aorta was crossclamped.  The left ventricle was emptied via the aortic  root vent.  Cardiac arrest  then was achieved with a combination of cold  antegrade blood cardioplegia and topical iced saline.  After achieving a  complete diastolic arrest and a myocardial septal temperature of 9 degrees  Celsius, the following distal anastomoses were performed:   First a reversed saphenous vein graft was placed end-to-side to the distal  right coronary.  The distal right coronary was a 2 mm good-quality target.  The vein was of good quality.  The anastomosis was performed end-to-side  with a running 7-0 Prolene suture.  There was excellent flow through the  graft.  Cardioplegia was administered.  There was good hemostasis.   Next a reversed saphenous vein graft was placed sequentially to obtuse  marginal 1 and 2 of the left circumflex coronary artery.  Obtuse marginal 1  was a 1.5 mm good-quality target.  OM-2 was 1.0 mm at the site of the  anastomosis, which was beyond the extensive stenting.  It was a fair-quality  vessel because of its small size.  A side-to-side anastomosis was performed  to OM-1 and an end-to-side to OM-2.  Both were probed proximally and  distally to ensure patency.  There was excellent flow through the graft.  Cardioplegia was administered, and there was good hemostasis.   Next the left internal mammary artery was brought through a window in the  pericardium and the distal end was spatulated and was anastomosed end-to-  side to the mid-LAD.  The LAD was a 2 mm good-quality target.  The mammary  was a good-quality conduit.  The anastomosis was performed with a running 8-  0 Prolene suture.  At the completion of the mammary to LAD anastomosis, the  bulldog clamp was briefly removed to inspect for hemostasis.  Immediate and  rapid septal rewarming was noted.  The bulldog clamp was replaced.  The  mammary pedicle was tacked to the epicardial surface of the heart with 6-0 Prolene sutures.  Additional cardioplegia was administered, and the vein  grafts were cut to length.    The cardioplegia cannula was removed from the ascending aorta and the  proximal vein graft anastomoses were performed to 4.0 mm punch aortotomies  with running 6-0 Prolene sutures.  At the completion of the final proximal  anastomosis, the patient was placed in Trendelenburg position.  Lidocaine  was administered and the bulldog clamp was again removed from the mammary  artery, and immediate and rapid septal rewarming was again noted.  The  aortic root was de-aired and then the aortic crossclamp was removed.  The  total crossclamp time was 75 minutes.  The patient spontaneously resumed a  rhythm and did not require defibrillation.   While the patient was being rewarmed, all proximal and distal anastomoses  were inspected for hemostasis.  Epicardial pacing wires were placed on the  right ventricle and right atrium.  When the patient had been rewarmed to a  core temperature of 37 degrees Celsius, she was weaned from cardiopulmonary  bypass without difficulty.  Total bypass time was 120 minutes.   The initial cardiac index was greater than 2 L/min. per sq. m, and the  patient remained hemodynamically stable throughout the post bypass period.  A test dose of protamine was administered and was well-tolerated.  The  atrial and aortic cannulae were removed.  The remainder of the protamine was  administered without incident.  The chest was irrigated with 1 L of warm  normal saline containing 1 g of vancomycin.  Hemostasis was achieved.  The  pericardium was reapproximated with interrupted 3-0 silk sutures that came  together easily without tension.  A left pleural and two mediastinal chest  tubes were placed through separate subcostal incisions.  The sternum was  closed with heavy-gauge interrupted stainless steel wires.  The remainder of  the incisions were closed in standard fashion.  Subcuticular closures were  used for the skin.  All sponge, needle, and instrument counts were correct  at the  end of the procedure.  The patient was taken from the operating room  to the surgical intensive care unit in critical but stable condition.                                               Salvatore Decent Dorris Fetch, M.D.    SCH/MEDQ  D:  07/08/2003  T:  07/09/2003  Job:  [id number]   cc:   Darlin Priestly, M.D.  [phone], [address]  [place]  [place] [address]  Fax: [phone]   Brooke Bonito, M.D.  [address]  [place]  [place] [address]  Fax: [phone]

## 2010-08-07 NOTE — Cardiovascular Report (Signed)
Brenda [name], Brenda [name]                         ACCOUNT NO.:  [id number]   MEDICAL RECORD NO.:  [id number]                   PATIENT TYPE:  INP   LOCATION:  2001                                 FACILITY:  [facility]   PHYSICIAN:  Cristy Hilts. Jacinto Halim, M.D.                  DATE OF BIRTH:  [birth year]   DATE OF PROCEDURE:  07/02/2003  DATE OF DISCHARGE:                              CARDIAC CATHETERIZATION   PROCEDURE PERFORMED:  1. Left ventriculography.  2. Selective right and left coronary arteriography.  3. Right innominate and right subclavian arteriography with visualization of     RIMA.   HISTORY:  Brenda [name] is [name] with history  of percutaneous transluminal coronary angioplasty and stenting of the  circumflex on December 05, 1998 and history of obtuse marginal stenting on  April 03, 2002 who had undergone cutting balloon angioplasty and balloon  angioplasty on March 21, 2003 of the obtuse marginal and also of the  circumflex coronary artery.  She now presents with recurrent chest pain  suggestive of unstable angina.  She had positive troponins.   HEMODYNAMIC DATA:  1. The left ventricular pressures were 190/11 with end-diastolic pressure of     19 mmHg.  2. Aortic pressure 104/54 with a mean of 73 mmHg.  3. There was no pressure gradient across the aortic valve.   ANGIOGRAPHIC DATA:  Left ventricle:  Left ventricular systolic function was  normal and ejection fraction was estimated at 60%.   Right coronary artery:  The right coronary artery is a large caliber vessel  and dominant vessel.  Mid segment has smooth 70% stenosis which appears to  have slightly progressed from prior cardiac catheterization.  The distal RCA  is large caliber vessel and is smooth.   Left main coronary artery:  Left main coronary artery is a large caliber  vessel. It is normal.   Circumflex coronary artery:  Circumflex coronary artery is a large vessel  and has an  ostial 70 at most 80% stenosis.  From the proximal segment all  the way up to the mid to distal segment there are three long stents that  have proximal 95% stenosis and in the mid segment is subtotally occluded.  The distal circumflex is visualized by faint filling.  The obtuse marginal  one is well visualized and has an ostial in-stent 99% stenosis.   Left anterior descending artery:  Left anterior descending artery is smooth  and has mild luminal irregularity.  Ostium has 20% stenosis.  There is a  small-to-moderate size diagonal one which is widely patent.   Right innominate and right subclavian:  They are widely patent.  The RIMA is  widely patent.   IMPRESSION:  1. 70% stenosis in a large mid right coronary artery.  2. Ostial 70% stenosis of the circumflex with 95% proximal in-stent     restenosis of the circumflex  and subtotally occluded mid to distal     circumflex.  Obtuse marginal has in-stent 99% stenosis.  3. Noncritical coronary artery disease of the left anterior descending     artery.   RECOMMENDATIONS:  Given her complex anatomy, the patient will benefit from  coronary artery bypass grafting.  A CVTS consultation will be obtained.   TECHNIQUE OF PROCEDURE:  Under usual sterile precautions, using a 6 French  right femoral arterial access, a 6 [place] multipurpose pigtail catheter was  advanced into the ascending aorta over a 0.035-inch J wire.  The catheter  was then gently advanced to the left ventricle and left ventricular  pressures were monitored.  Hand contrast injection of the left ventricle was  performed both in the LAO and RAO projection.  The catheter was flushed with  saline and pulled back into the ascending aorta and pressure gradient across  the aortic valve was monitored.  The right coronary artery was selectively  engaged and angiography was performed.  In a similar fashion, left main  coronary artery was selectively engaged and angiography was  performed.  Then, the catheter was utilized to engage the right innominate artery and  angiography was performed.  Then, the catheter was pulled out of the body in  the usual fashion.  The patient tolerated the procedure well.                                               Cristy Hilts. Jacinto Halim, M.D.    Pilar Plate  D:  07/02/2003  T:  07/03/2003  Job:  [id number]

## 2010-08-07 NOTE — Op Note (Signed)
   NAMEVENIA, RIVERON                         ACCOUNT NO.:  [id number]   MEDICAL RECORD NO.:  [id number]                   PATIENT TYPE:  AMB   LOCATION:  ENDO                                 FACILITY:  [facility]   PHYSICIAN:  Georgiana Spinner, M.D.                 DATE OF BIRTH:  [birth year]   DATE OF PROCEDURE:  10/03/2002  DATE OF DISCHARGE:                                 OPERATIVE REPORT   PROCEDURE:  Colonoscopy.   ANESTHESIA:  Demerol 40 mg, Versed 3.   DESCRIPTION OF PROCEDURE:  With the patient mildly sedated and in the left  lateral decubitus position, the Olympus videoscopic colonoscope was inserted  in the rectum and passed under direct vision to the cecum, identified by  ileocecal valve and appendiceal orifice, both of which were photographed.  From this point,  the colonoscope was slowly withdrawn, taking  circumferential views of the entire colonic mucosa stopping only in the  rectum which appeared normal on direct and showed a small hemorrhoid on  retroflexed view.  The endoscope was straightened and withdrawn.  The  patient's vital signs and pulse oximetry remained stable.  The patient  tolerated the procedure well without apparent complications.   FINDINGS:  Internal hemorrhoids, some diverticula seen in the sigmoid colon;  otherwise unremarkable exam.   PLAN:  See endoscopy note for further details.                                               Georgiana Spinner, M.D.    GMO/MEDQ  D:  10/03/2002  T:  10/03/2002  Job:  [id number]

## 2010-08-07 NOTE — H&P (Signed)
NAMETIAIRA, ARAMBULA                         ACCOUNT NO.:  [id number]   MEDICAL RECORD NO.:  [id number]                   PATIENT TYPE:  INP   LOCATION:  1828                                 FACILITY:  [facility]   PHYSICIAN:  Quita Skye. Waldon Reining, MD             DATE OF BIRTH:  06-19-1945   DATE OF ADMISSION:  06/28/2003  DATE OF DISCHARGE:                                HISTORY & PHYSICAL   Brenda [name] is a 65 year old white woman who was admitted to [facility] for further evaluation of chest pain.   The patient has an extensive history of coronary artery disease.  She has  previously undergone numerous percutaneous coronary interventions.  Her last  procedure was a cutting balloon angioplasty to the circumflex on March 21, 2003.   The patient presented to the emergency department tonight with a history of  chest pain which began this morning.  The chest discomfort has waxed and  waned throughout the course of the day.  It is described as an anterior  chest pressure.  It radiates to her arms, elbows, neck, and jaw bilaterally.  It has not been associated with dyspnea, diaphoresis, or nausea.  There were  no exacerbating or ameliorating factors.  The patient reports that this  chest pain is the same as her prior cardiac chest pain.  Her chest pain has  subsided somewhat since presentation to the emergency department and  initiation of therapy.   The patient has a number of medical problems.  These include hypertension,  dyslipidemia, insulin-dependent diabetes mellitus, fibromyalgia, and  peripheral neuropathy.  She also has a history of iron-deficiency anemia.   She is reportedly allergic to PENICILLIN, SULFA, and CODEINE.  In addition,  she is intolerant of LESCOL and ZOCOR.   The patient lives with her husband.   Her current medications include insulin, Prevacid, Tricor, isosorbide  mononitrate, Lasix, Lotensin, Claritin, Plavix, Mobic, atenolol, iron,  Flonase, aspirin, and amitriptyline.   REVIEW OF SYSTEMS:  No new problems related to her head, eyes, ears, nose,  mouth, throat, lungs, gastrointestinal system, genitourinary system, or  extremities.  There is no history of neurologic or psychiatric disorder.  There is no history of fever, chills, or weight loss.   PHYSICAL EXAMINATION:  VITAL SIGNS:  Blood pressure 100/44, pulse 72 and  regular, respirations 24, temperature 98.2.  GENERAL:  The patient was a middle-aged white woman in no discomfort.  She  was alert, oriented, appropriate, and responsive.  HEENT:  Head, eyes, nose, and mouth were normal.  NECK:  Without thyromegaly or adenopathy.  Carotid pulses were palpable  bilaterally and without bruits.  CARDIAC:  Normal S1 and S2.  There was no S3, S4, murmur, rub, or click.  Cardiac rhythm was regular.  CHEST:  No chest wall tenderness was noted.  The lungs were clear.  ABDOMEN:  Soft and nontender.  There was no mass,  hepatosplenomegaly, bruit,  distention, rebound, guarding, or rigidity.  Bowel sounds were normal.  BREASTS, PELVIC, RECTAL:  Examinations were not performed as they were not  pertinent to the reason for acute care hospitalization.  EXTREMITIES:  Without edema, deviation, or deformity.  Radial and dorsalis  pedis pulses were palpable bilaterally.  NEUROLOGIC:  Brief screening neurologic survey was unremarkable.   The electrocardiogram revealed normal sinus rhythm.  There were no specific  changes of ischemia or infarction.  The chest radiograph report was pending  at the time of this dictation.  Laboratory studies were pending at the time  of this dictation.   IMPRESSION:  1. Chest pain, rule out unstable angina.  2. Coronary artery disease, status post multiple percutaneous interventions.     Most recently (March 21, 2003) she underwent cutting balloon     percutaneous transluminal coronary angioplasty to the circumflex.  3. Hypertension.  4.  Dyslipidemia.  5. Insulin-dependent diabetes mellitus.  6. Fibromyalgia.  7. Peripheral neuropathy.   PLAN:  1. Telemetry.  2. Serial cardiac enzymes.  3. Aspirin.  4. Intravenous heparin.  5. Intravenous nitroglycerin.  6. Plavix.  7. Other measures per Dr. Jenne Campus.                                                Quita Skye. Waldon Reining, MD    MSC/MEDQ  D:  06/29/2003  T:  06/29/2003  Job:  [id number]   cc:   Darlin Priestly, M.D.  [phone] [address], [address]  [place]  [place] [address]  Fax: [phone]

## 2010-08-07 NOTE — Consult Note (Signed)
Brenda, [name]                         ACCOUNT NO.:  [id number]   MEDICAL RECORD NO.:  [id number]                   PATIENT TYPE:  INP   LOCATION:  2001                                 FACILITY:  [facility]   PHYSICIAN:  Salvatore Decent. Dorris Fetch, M.D.         DATE OF BIRTH:  1946/03/06   DATE OF CONSULTATION:  07/02/2003  DATE OF DISCHARGE:                                   CONSULTATION   REASON FOR CONSULTATION:  Coronary artery disease.   CHIEF COMPLAINT:  Chest pain and burning.   HISTORY OF PRESENT ILLNESS:  Brenda [name] is a 65 year old lady with a history  of type 2 insulin-dependent diabetes, dyslipidemia, hypertension,  fibromyalgia and coronary artery disease.  Her history of coronary disease  dates back to 2000 when she had angioplasty and stenting of her circumflex  obtuse marginal. She did well until January of last year when she had to  undergo a repeat procedure.  Since that time; this is her 5th admission  since January 2004 with multiple prior percutaneous interventions.   She states that during that period of time, she has never really gotten back  to her normal condition. She has always continued to have pain and always  continued to have fatigue, lack of energy and limited exercise tolerance.   On June 28, 2003, Brenda [name] presented to the emergency department with  chest discomfort. It started on the morning of June 28, 2003 and it waxed  and waned throughout the day.  She also noted that it had more of a burning  character, in addition to the pressure. It did radiate to her neck and jaw,  as well as both arms down to the elbows.  She did not have any nausea or  vomiting, diaphoresis or shortness of breath.   The patient was admitted for rule out MI.  Her MB was minimally elevated,  but with an elevated relative index.  Her CK-MB was 5.5 with a total CK of  111, troponin 0.026.  She has been stable and pain-free on intravenous  nitroglycerin as well as  subcutaneous Lovenox since admission.   Today, she underwent cardiac catheterization which showed a 70% stenosis in  her right coronary. There was complex stenosis in her left circumflex with  95% stenosis prior to the takeoff of OM1 and OM2. There was a 99% stenosis  at the take-off of OM1.  There was a long, subtotal occlusion in-stent in  the OM2.  There was a 20% stenosis at the ostium of the LAD, although I  believe that lesion likely is more significant.   PAST MEDICAL HISTORY:  Coronary artery disease, see HPI.  Hypertension,  hyperlipidemia, type 2 adult onset insulin-dependent diabetes which has been  brittle, fibromyalgia, peripheral neuropathy, bilateral carpal tunnel  syndromes, deviated nasal septum.   CURRENT MEDICATIONS:  Prevacid, TriCor, isosorbide mononitrate, Lasix,  Lotensin, Claritin, Plavix, Mobic, Atenolol, iron, aspirin, Flonase,  aspirin, amitriptyline.  She is now no intravenous nitroglycerin and  subcutaneous Lovenox.   ALLERGIES:  PENICILLIN, SULFA AND CODEINE.  SHE IS INTOLERANT OF LESCOL AND  ZOCOR.   SOCIAL HISTORY:  Patient is disabled secondary to recurrent coronary artery  disease as well as fibromyalgia. She lives with her husband. She smoked  until year 2000 but has not smoked for 5 years.   REVIEW OF SYSTEMS:  Brenda [name] states that she does fatigue easily. She does  not have much energy, has limited exercise tolerance. She has been having  significant chest pain for several weeks now but delayed coming to the  hospital.   PHYSICAL EXAMINATION:  VITAL SIGNS:  Blood pressure 120/50, pulse 68 and  regular, respirations 16.  GENERAL:  Brenda [name] is an obese, 65 year old white female in no acute  distress.  HEENT:  Unremarkable.  NECK:  Supple without thyromegaly, adenopathy or bruits.  LUNGS:  Clear with equal breath sounds bilaterally.  CARDIOVASCULAR:  Regular rate and rhythm. Normal S1 and S2, no rubs or  murmurs.  ABDOMEN:  Soft,  nontender, obese.  EXTREMITIES:  Without clubbing, cyanosis or edema. She has 2+ radial pulses  bilaterally but her hands are pale.  She has palpable pedal pulses  bilaterally.  NEUROLOGIC:  She is alert and oriented x3.  Motor function grossly intact  without focal deficit. She has decreased sensations in her legs bilaterally  below the knees.   LABORATORY DATA:  Cardiac enzymes as previously noted. Her PT was 13.3 with  INR of 1.0, on admission PTT was 110, on heparin today. White count 7.8,  hematocrit 30, platelets 141.  Sodium 140, potassium 4.3, glucose 159, BUN  and creatinine 24 and 1.5.  Chest x-ray shows calcified granuloma at the  right lung base, no active disease.  Her EKG shows left axis deviation and  left ventricular hypertrophy.   IMPRESSION:  Brenda [name] is a 65 year old lady who presents with unstable  angina. She has known coronary disease and at catheterization, has severe  disease in her left circumflex. She has had multiple previous percutaneous  interventions, which have failed to result in lasting relief of symptoms or  patency.  Also, she has what I believe is a 50% stenosis in the ostium of  her LAD and a lesion that would not be amenable to percutaneous  intervention. She also has a 70% stenosis in her right coronary.  Left  ventricular function is preserved with an ejection fraction of 60%.   I have discussed in detail with [name] the indications, risks,  benefits and alternatives of treatments.  Indications for bypass grafting in  this case would be myocardial preservation and relief of symptoms, but not a  strong survival benefit due to the lack of critical LAD disease. But, in my  option, the LAD should be grafted in addition to the right coronary, OM1 and  2.  In regards to OM2, it should be graftable, there is vessel that can be  seen filling late, despite the tight subtotal stenosis in the stent. Unfortunately, with the extent of work that  has been done to the OM2, there  may not be much vessel to work with now, and I think that will significantly  impact bypass graft patency in the long run.   I do not think she is a candidate for radial artery harvest because of her  bilateral carpal tunnel syndromes, and the fact that she has really what  appears to  be fairly poor perfusion in her hands to begin with.   Mr. and Mrs. Twining understand the risks and benefits of surgery. They  understand the risks include, but are not limited to, death, stroke, MI,  DVT, PE, possible need for transfusion, infection as well as other organ  systems dysfunction, respiratory, GI or renal complications. They also  understand that coronary artery bypass grafting is palliative and not  curative, and that there is risk of early and/or late graft failure. They  also understand that there is still possibility of progression of native  disease as well.   She has been on Plavix long-term and would be at very high risk for major  bleeding complications, if operated on while still taking Plavix. I will  discontinue Plavix today, with the recommendation to wait 5-7 days off  Plavix prior  to coronary artery bypass grafting unless unstable symptoms develop.  Therefore, our plan will be to proceed with surgery on Monday, April 18th.  If she were to develop unstable symptoms in the interim, we would proceed  with surgery; but with a high risk of massive transfusion and take back for  bleeding.                                               Salvatore Decent Dorris Fetch, M.D.    SCH/MEDQ  D:  07/02/2003  T:  07/03/2003  Job:  [id number]   cc:   Darlin Priestly, M.D.  [phone] [address], [address]  [place]  [place] [address]  Fax: [phone]

## 2010-10-14 ENCOUNTER — Other Ambulatory Visit: Payer: Self-pay | Admitting: Endocrinology

## 2010-10-14 DIAGNOSIS — R140 Abdominal distension (gaseous): Secondary | ICD-10-CM

## 2010-10-15 ENCOUNTER — Other Ambulatory Visit: Payer: Self-pay | Admitting: Endocrinology

## 2010-10-15 ENCOUNTER — Ambulatory Visit
Admission: RE | Admit: 2010-10-15 | Discharge: 2010-10-15 | Disposition: A | Discharge status: Home / Self Care | Payer: Medicare Other | Source: Ambulatory Visit | Attending: Endocrinology | Admitting: Endocrinology

## 2010-10-15 DIAGNOSIS — R140 Abdominal distension (gaseous): Secondary | ICD-10-CM

## 2011-02-20 ENCOUNTER — Encounter: Payer: Self-pay | Admitting: *Deleted

## 2011-02-20 ENCOUNTER — Inpatient Hospital Stay (HOSPITAL_COMMUNITY)
Admission: EM | Admit: 2011-02-20 | Discharge: 2011-02-23 | DRG: 286 | Disposition: A | Discharge status: Home / Self Care | Payer: Medicare Other | Attending: Cardiovascular Disease | Admitting: Cardiovascular Disease

## 2011-02-20 ENCOUNTER — Emergency Department (HOSPITAL_COMMUNITY): Payer: Medicare Other

## 2011-02-20 DIAGNOSIS — D696 Thrombocytopenia, unspecified: Secondary | ICD-10-CM | POA: Diagnosis present

## 2011-02-20 DIAGNOSIS — Z794 Long term (current) use of insulin: Secondary | ICD-10-CM

## 2011-02-20 DIAGNOSIS — I2582 Chronic total occlusion of coronary artery: Secondary | ICD-10-CM | POA: Diagnosis present

## 2011-02-20 DIAGNOSIS — Z79899 Other long term (current) drug therapy: Secondary | ICD-10-CM

## 2011-02-20 DIAGNOSIS — Z7982 Long term (current) use of aspirin: Secondary | ICD-10-CM

## 2011-02-20 DIAGNOSIS — K760 Fatty (change of) liver, not elsewhere classified: Secondary | ICD-10-CM

## 2011-02-20 DIAGNOSIS — I209 Angina pectoris, unspecified: Secondary | ICD-10-CM | POA: Diagnosis present

## 2011-02-20 DIAGNOSIS — I509 Heart failure, unspecified: Secondary | ICD-10-CM | POA: Diagnosis present

## 2011-02-20 DIAGNOSIS — K7689 Other specified diseases of liver: Secondary | ICD-10-CM | POA: Diagnosis present

## 2011-02-20 DIAGNOSIS — M9080 Osteopathy in diseases classified elsewhere, unspecified site: Secondary | ICD-10-CM | POA: Diagnosis present

## 2011-02-20 DIAGNOSIS — H269 Unspecified cataract: Secondary | ICD-10-CM | POA: Diagnosis present

## 2011-02-20 DIAGNOSIS — I129 Hypertensive chronic kidney disease with stage 1 through stage 4 chronic kidney disease, or unspecified chronic kidney disease: Secondary | ICD-10-CM | POA: Diagnosis present

## 2011-02-20 DIAGNOSIS — Z951 Presence of aortocoronary bypass graft: Secondary | ICD-10-CM

## 2011-02-20 DIAGNOSIS — Z9861 Coronary angioplasty status: Secondary | ICD-10-CM

## 2011-02-20 DIAGNOSIS — Z87891 Personal history of nicotine dependence: Secondary | ICD-10-CM

## 2011-02-20 DIAGNOSIS — Z880 Allergy status to penicillin: Secondary | ICD-10-CM

## 2011-02-20 DIAGNOSIS — I5031 Acute diastolic (congestive) heart failure: Secondary | ICD-10-CM | POA: Diagnosis present

## 2011-02-20 DIAGNOSIS — I2510 Atherosclerotic heart disease of native coronary artery without angina pectoris: Principal | ICD-10-CM | POA: Diagnosis present

## 2011-02-20 DIAGNOSIS — M8618 Other acute osteomyelitis, other site: Secondary | ICD-10-CM | POA: Diagnosis present

## 2011-02-20 DIAGNOSIS — N183 Chronic kidney disease, stage 3 unspecified: Secondary | ICD-10-CM | POA: Diagnosis present

## 2011-02-20 DIAGNOSIS — Z882 Allergy status to sulfonamides status: Secondary | ICD-10-CM

## 2011-02-20 DIAGNOSIS — E1169 Type 2 diabetes mellitus with other specified complication: Secondary | ICD-10-CM | POA: Diagnosis present

## 2011-02-20 DIAGNOSIS — I208 Other forms of angina pectoris: Secondary | ICD-10-CM | POA: Diagnosis present

## 2011-02-20 HISTORY — DX: Unspecified osteoarthritis, unspecified site: M19.90

## 2011-02-20 HISTORY — DX: Unspecified cirrhosis of liver: K74.60

## 2011-02-20 HISTORY — DX: Fatty (change of) liver, not elsewhere classified: K76.0

## 2011-02-20 HISTORY — DX: Non-pressure chronic ulcer of unspecified part of unspecified lower leg with unspecified severity: L97.909

## 2011-02-20 HISTORY — DX: Essential (primary) hypertension: I10

## 2011-02-20 HISTORY — DX: Thrombocytopenia, unspecified: D69.6

## 2011-02-20 HISTORY — DX: Atherosclerotic heart disease of native coronary artery without angina pectoris: I25.10

## 2011-02-20 HISTORY — DX: Other forms of angina pectoris: I20.89

## 2011-02-20 HISTORY — DX: Disorder of kidney and ureter, unspecified: N28.9

## 2011-02-20 HISTORY — DX: Other forms of angina pectoris: I20.8

## 2011-02-20 HISTORY — DX: Unspecified atrial fibrillation: I48.91

## 2011-02-20 HISTORY — DX: Heart failure, unspecified: I50.9

## 2011-02-20 HISTORY — DX: Fibromyalgia: M79.7

## 2011-02-20 HISTORY — DX: Chronic kidney disease, stage 3 (moderate): N18.3

## 2011-02-20 LAB — DIFFERENTIAL
Basophils Absolute: 0 10*3/uL (ref 0.0–0.1)
Basophils Relative: 0 % (ref 0–1)
Eosinophils Absolute: 0.2 10*3/uL (ref 0.0–0.7)
Eosinophils Relative: 4 % (ref 0–5)
Monocytes Absolute: 0.5 10*3/uL (ref 0.1–1.0)
Monocytes Relative: 8 % (ref 3–12)

## 2011-02-20 LAB — PROTIME-INR: INR: 1.12 (ref 0.00–1.49)

## 2011-02-20 LAB — CBC
HCT: 33.7 % — ABNORMAL LOW (ref 36.0–46.0)
Hemoglobin: 10.5 g/dL — ABNORMAL LOW (ref 12.0–15.0)
MCH: 25.4 pg — ABNORMAL LOW (ref 26.0–34.0)
MCHC: 31.2 g/dL (ref 30.0–36.0)
RDW: 17.5 % — ABNORMAL HIGH (ref 11.5–15.5)

## 2011-02-20 LAB — POCT I-STAT, CHEM 8
BUN: 21 mg/dL (ref 6–23)
Calcium, Ion: 1.15 mmol/L (ref 1.12–1.32)
Creatinine, Ser: 1.2 mg/dL — ABNORMAL HIGH (ref 0.50–1.10)
Glucose, Bld: 205 mg/dL — ABNORMAL HIGH (ref 70–99)
Hemoglobin: 10.9 g/dL — ABNORMAL LOW (ref 12.0–15.0)
TCO2: 27 mmol/L (ref 0–100)

## 2011-02-20 LAB — CARDIAC PANEL(CRET KIN+CKTOT+MB+TROPI)
Relative Index: INVALID (ref 0.0–2.5)
Troponin I: <0.3 ng/mL (ref <0.30)

## 2011-02-20 LAB — GLUCOSE, CAPILLARY

## 2011-02-20 MED ORDER — HEPARIN BOLUS VIA INFUSION
4000.0000 [IU] | Freq: Once | INTRAVENOUS | Status: AC
  Administered 2011-02-20: 4000 [IU] via INTRAVENOUS
  Filled 2011-02-20: qty 4000

## 2011-02-20 MED ORDER — BUMETANIDE 2 MG PO TABS
2.0000 mg | ORAL_TABLET | Freq: Every day | ORAL | Status: DC
  Administered 2011-02-21: 2 mg via ORAL
  Administered 2011-02-23: 1 mg via ORAL
  Filled 2011-02-20 (×3): qty 1

## 2011-02-20 MED ORDER — HEPARIN SOD (PORCINE) IN D5W 100 UNIT/ML IV SOLN
1400.0000 [IU]/h | INTRAVENOUS | Status: DC
  Administered 2011-02-20: 1000 [IU]/h via INTRAVENOUS
  Administered 2011-02-21 – 2011-02-22 (×2): 1250 [IU]/h via INTRAVENOUS
  Administered 2011-02-22: 1400 [IU]/h via INTRAVENOUS
  Filled 2011-02-20 (×5): qty 250

## 2011-02-20 MED ORDER — PANTOPRAZOLE SODIUM 40 MG PO TBEC
80.0000 mg | DELAYED_RELEASE_TABLET | Freq: Every day | ORAL | Status: DC
  Administered 2011-02-21 – 2011-02-23 (×3): 80 mg via ORAL
  Filled 2011-02-20 (×3): qty 2

## 2011-02-20 MED ORDER — DOCUSATE SODIUM 100 MG PO CAPS
200.0000 mg | ORAL_CAPSULE | Freq: Every day | ORAL | Status: DC
  Filled 2011-02-20 (×3): qty 2

## 2011-02-20 MED ORDER — INSULIN ASPART 100 UNIT/ML ~~LOC~~ SOLN
0.0000 [IU] | Freq: Three times a day (TID) | SUBCUTANEOUS | Status: DC
  Administered 2011-02-21 (×2): 8 [IU] via SUBCUTANEOUS
  Administered 2011-02-21: 2 [IU] via SUBCUTANEOUS
  Administered 2011-02-22: 8 [IU] via SUBCUTANEOUS
  Administered 2011-02-23: 5 [IU] via SUBCUTANEOUS
  Administered 2011-02-23: 3 [IU] via SUBCUTANEOUS
  Filled 2011-02-20: qty 3

## 2011-02-20 MED ORDER — BRIMONIDINE TARTRATE 0.2 % OP SOLN
1.0000 [drp] | Freq: Three times a day (TID) | OPHTHALMIC | Status: DC
  Administered 2011-02-21 – 2011-02-23 (×8): 1 [drp] via OPHTHALMIC
  Filled 2011-02-20: qty 5

## 2011-02-20 MED ORDER — FLUTICASONE PROPIONATE 50 MCG/ACT NA SUSP
2.0000 | Freq: Every day | NASAL | Status: DC
  Administered 2011-02-21 – 2011-02-23 (×3): 2 via NASAL
  Filled 2011-02-20: qty 16

## 2011-02-20 MED ORDER — POTASSIUM CHLORIDE CRYS ER 20 MEQ PO TBCR
40.0000 meq | EXTENDED_RELEASE_TABLET | Freq: Once | ORAL | Status: AC
  Administered 2011-02-20: 40 meq via ORAL
  Filled 2011-02-20: qty 2

## 2011-02-20 MED ORDER — LORATADINE 10 MG PO TABS
10.0000 mg | ORAL_TABLET | Freq: Every day | ORAL | Status: DC
  Administered 2011-02-21 – 2011-02-23 (×3): 10 mg via ORAL
  Filled 2011-02-20 (×3): qty 1

## 2011-02-20 MED ORDER — BIMATOPROST 0.03 % OP SOLN
1.0000 [drp] | Freq: Every day | OPHTHALMIC | Status: DC
  Administered 2011-02-21 – 2011-02-22 (×3): 1 [drp] via OPHTHALMIC
  Filled 2011-02-20: qty 5

## 2011-02-20 MED ORDER — ZOLPIDEM TARTRATE 5 MG PO TABS: 5.0000 mg | ORAL_TABLET | Freq: Every evening | ORAL | Status: DC | PRN

## 2011-02-20 MED ORDER — SOTALOL HCL 80 MG PO TABS
80.0000 mg | ORAL_TABLET | Freq: Two times a day (BID) | ORAL | Status: DC
  Administered 2011-02-21 – 2011-02-23 (×6): 80 mg via ORAL
  Filled 2011-02-20 (×7): qty 1

## 2011-02-20 MED ORDER — SODIUM CHLORIDE 0.9 % IV SOLN
INTRAVENOUS | Status: DC
  Administered 2011-02-20: 20 mL/h via INTRAVENOUS
  Administered 2011-02-21: 10 mL via INTRAVENOUS

## 2011-02-20 MED ORDER — ALPRAZOLAM 0.25 MG PO TABS: 0.2500 mg | ORAL_TABLET | Freq: Two times a day (BID) | ORAL | Status: DC | PRN

## 2011-02-20 MED ORDER — OMEGA-3-ACID ETHYL ESTERS 1 G PO CAPS
1.0000 g | ORAL_CAPSULE | Freq: Four times a day (QID) | ORAL | Status: DC
  Administered 2011-02-21 – 2011-02-23 (×10): 1 g via ORAL
  Filled 2011-02-20 (×13): qty 1

## 2011-02-20 MED ORDER — ONDANSETRON HCL 4 MG/2ML IJ SOLN: 4.0000 mg | Freq: Four times a day (QID) | INTRAMUSCULAR | Status: DC | PRN

## 2011-02-20 MED ORDER — ACETAMINOPHEN 500 MG PO TABS: 500.0000 mg | ORAL_TABLET | Freq: Four times a day (QID) | ORAL | Status: DC | PRN

## 2011-02-20 MED ORDER — NITROGLYCERIN 0.4 MG SL SUBL: 0.4000 mg | SUBLINGUAL_TABLET | SUBLINGUAL | Status: DC | PRN

## 2011-02-20 MED ORDER — NITROGLYCERIN IN D5W 200-5 MCG/ML-% IV SOLN
5.0000 ug/min | INTRAVENOUS | Status: DC
  Administered 2011-02-20: 5 ug/min via INTRAVENOUS
  Filled 2011-02-20: qty 250

## 2011-02-20 MED ORDER — ROSUVASTATIN CALCIUM 20 MG PO TABS
20.0000 mg | ORAL_TABLET | Freq: Every day | ORAL | Status: DC
  Administered 2011-02-21 – 2011-02-23 (×3): 20 mg via ORAL
  Filled 2011-02-20 (×3): qty 1

## 2011-02-20 MED ORDER — AMLODIPINE BESYLATE 2.5 MG PO TABS
2.5000 mg | ORAL_TABLET | Freq: Every day | ORAL | Status: DC
  Administered 2011-02-21 – 2011-02-23 (×3): 2.5 mg via ORAL
  Filled 2011-02-20 (×3): qty 1

## 2011-02-20 MED ORDER — ASPIRIN EC 81 MG PO TBEC
81.0000 mg | DELAYED_RELEASE_TABLET | Freq: Every day | ORAL | Status: DC
  Administered 2011-02-21 – 2011-02-23 (×2): 81 mg via ORAL
  Filled 2011-02-20 (×3): qty 1

## 2011-02-20 MED ORDER — ACETAMINOPHEN 325 MG PO TABS: 650.0000 mg | ORAL_TABLET | ORAL | Status: DC | PRN

## 2011-02-20 MED ORDER — SILVER SULFADIAZINE 1 % EX CREA
1.0000 "application " | TOPICAL_CREAM | Freq: Every day | CUTANEOUS | Status: DC
  Administered 2011-02-21: 1 via TOPICAL
  Filled 2011-02-20: qty 85
  Filled 2011-02-20: qty 50

## 2011-02-20 MED ORDER — INSULIN DETEMIR 100 UNIT/ML ~~LOC~~ SOLN
100.0000 [IU] | Freq: Two times a day (BID) | SUBCUTANEOUS | Status: DC
  Administered 2011-02-21 (×2): 100 [IU] via SUBCUTANEOUS
  Administered 2011-02-22: 50 [IU] via SUBCUTANEOUS
  Administered 2011-02-22 – 2011-02-23 (×2): 100 [IU] via SUBCUTANEOUS
  Filled 2011-02-20 (×2): qty 3

## 2011-02-20 MED ORDER — SIMETHICONE 80 MG PO CHEW
80.0000 mg | CHEWABLE_TABLET | Freq: Two times a day (BID) | ORAL | Status: DC
  Administered 2011-02-21 – 2011-02-23 (×6): 80 mg via ORAL
  Filled 2011-02-20 (×7): qty 1

## 2011-02-20 MED ORDER — METAXALONE 800 MG PO TABS
800.0000 mg | ORAL_TABLET | Freq: Every day | ORAL | Status: DC
  Filled 2011-02-20 (×3): qty 1

## 2011-02-20 MED ORDER — FENOFIBRATE 160 MG PO TABS
160.0000 mg | ORAL_TABLET | Freq: Every day | ORAL | Status: DC
  Administered 2011-02-21 – 2011-02-23 (×3): 160 mg via ORAL
  Filled 2011-02-20 (×3): qty 1

## 2011-02-20 NOTE — ED Notes (Signed)
Pt is resting family is at bedside

## 2011-02-20 NOTE — ED Provider Notes (Signed)
Brenda [name] was seen by Dr. Allyson Sabal. Heparin and nitroglycerin were started. Patient will be admitted to step down unit per Dr. Allyson Sabal.   Brenda Numbers, MD 02/20/11 [phone]

## 2011-02-20 NOTE — ED Notes (Signed)
Pt states that she has had chest pain for months.  Pt has been taking NTG for same.  Pt has had 2 NTG this am with SOB.  Pt has diaphoresis with same.

## 2011-02-20 NOTE — ED Provider Notes (Signed)
History     CSN: [id number] Arrival date & time: 02/20/2011 11:20 AM   First MD Initiated Contact with Patient 02/20/11 1153      Chief Complaint  Patient presents with  . Chest Pain    (Consider location/radiation/quality/duration/timing/severity/associated sxs/prior treatment) HPI   Pt states that she has had chest pain for months. Pt has been taking NTG for same. Pt has had 2 NTG this am with SOB. Pt has diaphoresis with same.  Patient had a cardiac catheterization in 2001 and which showed extensive disease but no indication for stent at that time.  She has seen her cardiologist in the last couple weeks and has an appointment to see him early next week.  The pain that she is experiencing can occur at rest or with any movement of her upper extremities her chest.  Past Medical History  Diagnosis Date  . Angina at rest   . Coronary artery disease   . Hypertension   . Diabetes mellitus   . Renal disorder   . CHF (congestive heart failure)   . Arthritis   . Cirrhosis     Past Surgical History  Procedure Date  . Coronary angioplasty with stent placement   . Coronary artery bypass graft   . Cesarean section   . Appendectomy   . Bunionectomy   . Amputation     History reviewed. No pertinent family history.  History  Substance Use Topics  . Smoking status: Never Smoker   . Smokeless tobacco: Not on file  . Alcohol Use: No    OB History    Grav Para Term Preterm Abortions TAB SAB Ect Mult Living                  Review of Systems  Constitutional: Positive for fatigue. Negative for fever.  Cardiovascular: Positive for chest pain.  Musculoskeletal: Positive for back pain.  All other systems reviewed and are negative.    Allergies  Aspirin; Ciprofloxacin; Codeine; Fluvastatin sodium; Penicillins; Simvastatin; and Sulfonamide derivatives  Home Medications   Current Outpatient Rx  Name Route Sig Dispense Refill  . ACETAMINOPHEN 500 MG PO TABS Oral Take 500  mg by mouth every 6 (six) hours as needed. For pain.     Marland Kitchen AMLODIPINE BESYLATE 2.5 MG PO TABS Oral Take 2.5 mg by mouth daily.      . ASPIRIN EC 81 MG PO TBEC Oral Take 81 mg by mouth daily.      Marland Kitchen BIMATOPROST 0.01 % OP SOLN Both Eyes Place 1 drop into both eyes at bedtime.      Marland Kitchen BRIMONIDINE TARTRATE 0.1 % OP SOLN Both Eyes Place 1 drop into both eyes 3 (three) times daily.      . BUMETANIDE 2 MG PO TABS Oral Take 2 mg by mouth daily.      . CHOLINE FENOFIBRATE 135 MG PO CPDR Oral Take 135 mg by mouth daily.      . CHOLINE FENOFIBRATE 45 MG PO CPDR Oral Take 45 mg by mouth daily.      Marland Kitchen DOCUSATE SODIUM 100 MG PO CAPS Oral Take 200 mg by mouth at bedtime.      Marland Kitchen ESOMEPRAZOLE MAGNESIUM 40 MG PO CPDR Oral Take 40 mg by mouth daily before breakfast.      . FLUTICASONE PROPIONATE 50 MCG/ACT NA SUSP Nasal Place 2 sprays into the nose daily.      . INSULIN DETEMIR 100 UNIT/ML [place] SOLN Subcutaneous Inject 100 Units into the  skin 2 (two) times daily.      . INSULIN LISPRO (HUMAN) 100 UNIT/ML  SOLN Subcutaneous Inject into the skin 3 (three) times daily before meals. SSI- Pt not sure of SS.     . ISOSORBIDE MONONITRATE 20 MG PO TABS Oral Take 10 mg by mouth 2 (two) times daily.      Marland Kitchen LORATADINE 10 MG PO TABS Oral Take 10 mg by mouth daily.      Marland Kitchen METAXALONE 800 MG PO TABS Oral Take 800 mg by mouth daily.      . MOMETASONE FUROATE 50 MCG/ACT NA SUSP Nasal Place 2 sprays into the nose daily.      Marland Kitchen NITROGLYCERIN 0.4 MG SL SUBL Sublingual Place 0.4 mg under the tongue every 5 (five) minutes as needed. For pain.     Marland Kitchen OMEGA-3-ACID ETHYL ESTERS 1 G PO CAPS Oral Take 1 g by mouth 4 (four) times daily.      Marland Kitchen ROSUVASTATIN CALCIUM 20 MG PO TABS Oral Take 20 mg by mouth daily.      Marland Kitchen SILVER SULFADIAZINE 1 % EX CREA Topical Apply 1 application topically daily.      Marland Kitchen SIMETHICONE 125 MG PO CHEW Oral Chew 125 mg by mouth 3 (three) times daily.      Marland Kitchen SOTALOL HCL 80 MG PO TABS Oral Take 80 mg by mouth 2 (two)  times daily.        BP 141/58  Pulse 67  Temp(Src) 97.6 F (36.4 C) (Oral)  Resp 14  SpO2 100%  Physical Exam  Nursing note and vitals reviewed. Constitutional: She is oriented to person, place, and time. She appears well-developed and well-nourished. No distress.  HENT:  Head: Normocephalic and atraumatic.  Eyes: Pupils are equal, round, and reactive to light.  Neck: Normal range of motion.  Cardiovascular: Normal rate and intact distal pulses.  Exam reveals no gallop.   No murmur heard.        Date: 02/20/2011  Rate: 68  Rhythm: normal sinus rhythm  QRS Axis: normal  Intervals: Prolonged QT  ST/T Wave abnormalities: normal  Conduction Disutrbances:none:   Old EKG Reviewed: none available     Pulmonary/Chest: No respiratory distress. She has no wheezes. She has no rales. She exhibits tenderness.  Abdominal: Normal appearance. She exhibits no distension.  Musculoskeletal: Normal range of motion.  Neurological: She is alert and oriented to person, place, and time. No cranial nerve deficit.  Skin: Skin is warm and dry. No rash noted.  Psychiatric: She has a normal mood and affect. Her behavior is normal.    ED Course  Procedures (including critical care time) [facility](Dr. Allyson Sabal) will come see the patient Labs Reviewed  CBC - Abnormal; Notable for the following:    Hemoglobin 10.5 (*)    HCT 33.7 (*)    MCH 25.4 (*)    RDW 17.5 (*)    Platelets 122 (*)    All other components within normal limits  POCT I-STAT, CHEM 8 - Abnormal; Notable for the following:    Potassium 3.3 (*)    Creatinine, Ser 1.20 (*)    Glucose, Bld 205 (*)    Hemoglobin 10.9 (*)    HCT 32.0 (*)    All other components within normal limits  DIFFERENTIAL  POCT I-STAT TROPONIN I  I-STAT TROPONIN I  I-STAT, CHEM 8   Dg Chest 2 View  02/20/2011  *RADIOLOGY REPORT*  Clinical Data: Chest pain, hypertension, diabetes  CHEST -  2 VIEW  Comparison: 12/05/2010  Findings: Previous coronary  bypass changes noted.  Stable borderline cardiomegaly and vascular prominence.  Calcified stable granuloma in the right lower lobe.  No new edema, CHF, consolidation, pneumonia, effusion or pneumothorax.  Trachea midline.  Degenerative changes of the spine.  IMPRESSION: Stable postoperative findings.  Remote granulomatous disease.  No acute process.  Original Report Authenticated By: Judie Petit. Ruel Favors, M.D.     Dx:  Chest Pain   MDM          Nelia Shi, MD 02/20/11 [phone]

## 2011-02-20 NOTE — Progress Notes (Signed)
ANTICOAGULATION CONSULT NOTE - Initial Consult  Pharmacy Consult for Heparin Indication: chest pain/ACS  Allergies  Allergen Reactions  . Aspirin     REACTION: use only enteric coated  . Ciprofloxacin     REACTION: Felt heart palps and "weird"  . Codeine     REACTION: nausea/vomiting  . Fluvastatin Sodium   . Penicillins     REACTION: rash  . Simvastatin Hives  . Sulfonamide Derivatives Nausea Only    Patient Measurements: Height: 5\' 6"  (167.6 cm) Weight: 198 lb (89.812 kg) IBW/kg (Calculated) : 59.3  Heparin Dosing Weight: 78.8 kg  Vital Signs: Temp: 97.6 F (36.4 C) (12/01 1121) Temp src: Oral (12/01 1121) BP: 139/52 mmHg (12/01 1730) Pulse Rate: 72  (12/01 1651)  Labs:  Basename 02/20/11 1812 02/20/11 1319 02/20/11 1230  HGB -- 10.9* 10.5*  HCT -- 32.0* 33.7*  PLT -- -- 122*  APTT -- -- --  LABPROT 14.6 -- --  INR 1.12 -- --  HEPARINUNFRC -- -- --  CREATININE -- 1.20* --  CKTOTAL -- -- --  CKMB -- -- --  TROPONINI -- -- --   Estimated Creatinine Clearance: 52.8 ml/min (by C-G formula based on Cr of 1.2).  Medical History: Past Medical History  Diagnosis Date  . Angina at rest   . Coronary artery disease   . Hypertension   . Diabetes mellitus   . Renal disorder   . CHF (congestive heart failure)   . Arthritis   . Cirrhosis   . CAD (coronary artery disease) 02/20/2011  . CHRONIC KIDNEY DISEASE STAGE III (MODERATE) 05/12/2009  . DM 05/12/2009  . Ulcer of lower limb, unspecified 05/12/2009  . Cataract 02/20/2011  . NAFLD (nonalcoholic fatty liver disease) 16/03/958  . Fibromyalgia     Medications:  Home meds pending  Assessment: 65 y.o. F to start heparin for ACS sx. Wt: 89.8 kg (Hep Wt: 78.7 kg), SCr 1.2, Crcl~50 ml/min. Hgb/Hct/Plt ok. No recent surgeries or bleeding noted.   Goal of Therapy:  Heparin level 0.3-0.7 units/ml   Plan:  1. Heparin bolus of 4000 units x 1 2. Initiate heparin drip rate at 1000 units/hr (10 ml/hr) 3. Will  continue to monitor for any signs/symptoms of bleeding and will follow up with heparin level in 6 hours   Rolley Sims 02/20/2011,7:06 PM

## 2011-02-20 NOTE — H&P (Signed)
Brenda [name] is an 65 y.o. female.   Chief Complaint: chest pain though has chronic chest pain it has become more significant.  HPI: 65 year old white married female mother of one and one grandchild who stopped smoking in 1999 presents to the emergency room today for chest pain. She is normally followed by Dr. Alanda Amass her primary care physician is Dr. Lucky Cowboy has a history of coronary artery bypass grafting in 2005 by Dr. Dorris Fetch. Her last heart catheterization was July 2011 she had significant native disease but patent LIMA to the LAD, patent vein graft to the OEM with the distal sequential limb occluded to the ON 2 with retrograde collaterals from the right. She also had patent vein graft to the RCA and normal EF medical therapy was recommended. Her last 2-D echo was May of 2012 within normal EF and diastolic relaxation abnormality.  Over the last several months she's had chest pain but since November 6 is thin and more increased she's been taking aspirin frequently and more and more nitroglycerin sublingual for her chest pain today with even more severe. Some chest pain both arm pain and tingling. Currently here in the emergency room her pain comes and goes.  Her pain is somewhat difficult to access secondary to her fibromyalgia.  We will admit her to step down and begin IV heparin and IV nitroglycerin.  Past Medical History  Diagnosis Date  . Angina at rest   . Coronary artery disease   . Hypertension   . Diabetes mellitus   . Renal disorder   . CHF (congestive heart failure)   . Arthritis   . Cirrhosis   . CAD (coronary artery disease) 02/20/2011  . CHRONIC KIDNEY DISEASE STAGE III (MODERATE) 05/12/2009  . DM 05/12/2009  . Ulcer of lower limb, unspecified 05/12/2009  . Cataract 02/20/2011  . NAFLD (nonalcoholic fatty liver disease) 91/06/7827  . Fibromyalgia     Past Surgical History  Procedure Date  . Coronary angioplasty with stent placement   . Coronary artery bypass  graft   . Cesarean section   . Appendectomy   . Bunionectomy   . Amputation     History reviewed. No pertinent family history. Social History:  reports that she has never smoked. She does not have any smokeless tobacco history on file. She reports that she does not drink alcohol or use illicit drugs.  Allergies:  Allergies  Allergen Reactions  . Aspirin     REACTION: use only enteric coated  . Ciprofloxacin     REACTION: Felt heart palps and "weird"  . Codeine     REACTION: nausea/vomiting  . Fluvastatin Sodium   . Penicillins     REACTION: rash  . Simvastatin Hives  . Sulfonamide Derivatives Nausea Only    Medications Prior to Admission  Medication Dose Route Frequency Provider Last Rate Last Dose  . 0.9 %  sodium chloride infusion   Intravenous Continuous Leone Brand, NP 20 mL/hr at 02/20/11 1739 20 mL/hr at 02/20/11 1739  . nitroGLYCERIN 0.2 mg/mL in dextrose 5 % infusion  5 mcg/min Intravenous Titrated Leone Brand, NP 1.5 mL/hr at 02/20/11 1743 5 mcg/min at 02/20/11 1743  . potassium chloride SA (K-DUR,KLOR-CON) CR tablet 40 mEq  40 mEq Oral Once Leone Brand, NP   40 mEq at 02/20/11 1737   No current outpatient prescriptions on file as of 02/20/2011.    Results for orders placed during the hospital encounter of 02/20/11 (from the past 48 hour(s))  CBC     Status: Abnormal   Collection Time   02/20/11 12:30 PM      Component Value Range Comment   WBC 6.0  4.0 - 10.5 (K/uL)    RBC 4.14  3.87 - 5.11 (MIL/uL)    Hemoglobin 10.5 (*) 12.0 - 15.0 (g/dL)    HCT 16.1 (*) 09.6 - 46.0 (%)    MCV 81.4  78.0 - 100.0 (fL)    MCH 25.4 (*) 26.0 - 34.0 (pg)    MCHC 31.2  30.0 - 36.0 (g/dL)    RDW 04.5 (*) 40.9 - 15.5 (%)    Platelets 122 (*) 150 - 400 (K/uL)   DIFFERENTIAL     Status: Normal   Collection Time   02/20/11 12:30 PM      Component Value Range Comment   Neutrophils Relative 64  43 - 77 (%)    Neutro Abs 3.8  1.7 - 7.7 (K/uL)    Lymphocytes Relative 24  12 -  46 (%)    Lymphs Abs 1.4  0.7 - 4.0 (K/uL)    Monocytes Relative 8  3 - 12 (%)    Monocytes Absolute 0.5  0.1 - 1.0 (K/uL)    Eosinophils Relative 4  0 - 5 (%)    Eosinophils Absolute 0.2  0.0 - 0.7 (K/uL)    Basophils Relative 0  0 - 1 (%)    Basophils Absolute 0.0  0.0 - 0.1 (K/uL)   POCT I-STAT TROPONIN I     Status: Normal   Collection Time   02/20/11  1:16 PM      Component Value Range Comment   Troponin i, poc 0.02  0.00 - 0.08 (ng/mL)    Comment 3            POCT I-STAT, CHEM 8     Status: Abnormal   Collection Time   02/20/11  1:19 PM      Component Value Range Comment   Sodium 141  135 - 145 (mEq/L)    Potassium 3.3 (*) 3.5 - 5.1 (mEq/L)    Chloride 102  96 - 112 (mEq/L)    BUN 21  6 - 23 (mg/dL)    Creatinine, Ser 8.11 (*) 0.50 - 1.10 (mg/dL)    Glucose, Bld 914 (*) 70 - 99 (mg/dL)    Calcium, Ion 7.82  1.12 - 1.32 (mmol/L)    TCO2 27  0 - 100 (mmol/L)    Hemoglobin 10.9 (*) 12.0 - 15.0 (g/dL)    HCT 95.6 (*) 21.3 - 46.0 (%)   GLUCOSE, CAPILLARY     Status: Abnormal   Collection Time   02/20/11  5:47 PM      Component Value Range Comment   Glucose-Capillary 108 (*) 70 - 99 (mg/dL)    Comment 1 Documented in Chart      Comment 2 Notify RN      Dg Chest 2 View  02/20/2011  *RADIOLOGY REPORT*  Clinical Data: Chest pain, hypertension, diabetes  CHEST - 2 VIEW  Comparison: 12/05/2010  Findings: Previous coronary bypass changes noted.  Stable borderline cardiomegaly and vascular prominence.  Calcified stable granuloma in the right lower lobe.  No new edema, CHF, consolidation, pneumonia, effusion or pneumothorax.  Trachea midline.  Degenerative changes of the spine.  IMPRESSION: Stable postoperative findings.  Remote granulomatous disease.  No acute process.  Original Report Authenticated By: Judie Petit. Ruel Favors, M.D.    ROSDicky Doe.: No colds or fevers. Skin: No rashes  or ulcers. HEENT: No blurred vision. Does have cataracts at least in her left and needs removal but they did not  wish to do surgery at this point due to her diabetes is uncontrolled. Cardiovascular: Increasing episodes of chest pain. These are relieved with sublingual nitroglycerin but the pain will return. Pulmonary no shortness of breath. GI was told that she had liver damage perhaps cirrhosis was seen by Dr. Randa Evens and he felt this was more nonalcoholic fatty liver disease. GU no hematuria or dysuria  Renal: Was told she had increasing renal disease and that her arteries were blocking her last abdominal angiogram by Dr. Alanda Amass was October 2004 and are widely patent at that time Musculoskeletal: has had prior left first and second toe amputation for diabetic small vessel peripheral arterial disease RABI 0.98, left ABI 1.06 with three-vessel runoff bilaterally no significant FFA or iliac disease. Endocrine. Diabetic at times poorly controlled.  Blood pressure 139/52, pulse 72, temperature 97.6 F (36.4 C), temperature source Oral, resp. rate 16, SpO2 99.00%. PE: General: Alert oriented white female, pleasant affect,mild chest pain currently.  Skin: No rashes or ulcers, brisk appearing refill. HEENT: Glasses in place normocephalic sclera clear. Neck: Supple no JVD, no carotid bruits. Heart: S1-S2 regular rate and rhythm without obvious murmur gallop or click. Lungs: Clear without rales rhonchi or wheezes. Abdomen obese soft nontender positive bowel sounds do not palpate liver spleen or masses. Extremities: No lower extremity edema pedal pulses are present. Neuro: Alert oriented x3 moves all extremities follows commands.   Assessment/Plan Patient Active Problem List  Diagnoses  . DM  . ACUTE DIASTOLIC HEART FAILURE  . CHRONIC KIDNEY DISEASE STAGE III (MODERATE)  . Ulcer of lower limb, unspecified  . Acute osteomyelitis, ankle and foot  . Angina at rest  . CAD (coronary artery disease)  . Cataract  . NAFLD (nonalcoholic fatty liver disease)   Plan: Dr. Allyson Sabal is seen and examined the  patient. We'll admit her to the step down unit do serial cardiac enzymes, serial EKGs. Begin IV nitroglycerin and IV heparin. Plan will be for cardiac catheterization on Monday, 02/22/2011. Will use sliding scale insulin during hospitalization.  AVWUJW,JXBJY R 02/20/2011, 5:54 PM colon  Agree with note written by Nada Boozer RNP   Runell Gess 02/21/2011 7:48 AM  The patient is a 64 year old female with a prior history of coronary artery disease. She is followed by Dr. Susa Griffins is a history of artery bypass grafting in 2005. She had a cardiac catheterization in July of last year revealing essentially stable anatomy.problems include hypertension, diabetes, and peripheral vascular occlusive disease. She complains of having nitroglycerin responsive chest pain over onth which has accelerated in frequency and severity. She came to the hospital chest pain and was pain-free at the time of exam. Her EKG showed no acute changes and her initial cardiac enzymes were normal. Her exam was normal. Given her history and clinical presentation I elected to place her on intravenous nitroglycerin and heparin . The plan will be to perform cardiac catheterization on Monday to define anatomy.

## 2011-02-21 ENCOUNTER — Encounter (HOSPITAL_COMMUNITY): Payer: Self-pay | Admitting: Cardiology

## 2011-02-21 DIAGNOSIS — D696 Thrombocytopenia, unspecified: Secondary | ICD-10-CM

## 2011-02-21 HISTORY — DX: Thrombocytopenia, unspecified: D69.6

## 2011-02-21 LAB — LIPID PANEL
Cholesterol: 95 mg/dL (ref 0–200)
HDL: 28 mg/dL — ABNORMAL LOW (ref >39)
Total CHOL/HDL Ratio: 3.4 RATIO
VLDL: 38 mg/dL (ref 0–40)

## 2011-02-21 LAB — CARDIAC PANEL(CRET KIN+CKTOT+MB+TROPI)
Relative Index: INVALID (ref 0.0–2.5)
Total CK: 97 U/L (ref 7–177)
Troponin I: <0.3 ng/mL (ref <0.30)

## 2011-02-21 LAB — BASIC METABOLIC PANEL
BUN: 21 mg/dL (ref 6–23)
Chloride: 102 mEq/L (ref 96–112)
GFR calc Af Amer: 65 mL/min — ABNORMAL LOW (ref >90)
GFR calc non Af Amer: 56 mL/min — ABNORMAL LOW (ref >90)
Glucose, Bld: 124 mg/dL — ABNORMAL HIGH (ref 70–99)
Potassium: 4.6 mEq/L (ref 3.5–5.1)
Sodium: 136 mEq/L (ref 135–145)

## 2011-02-21 LAB — GLUCOSE, CAPILLARY
Glucose-Capillary: 188 mg/dL — ABNORMAL HIGH (ref 70–99)
Glucose-Capillary: 293 mg/dL — ABNORMAL HIGH (ref 70–99)
Glucose-Capillary: 260 mg/dL — ABNORMAL HIGH (ref 70–99)
Glucose-Capillary: 249 mg/dL — ABNORMAL HIGH (ref 70–99)

## 2011-02-21 LAB — CBC
HCT: 31.9 % — ABNORMAL LOW (ref 36.0–46.0)
Hemoglobin: 9.9 g/dL — ABNORMAL LOW (ref 12.0–15.0)
RDW: 17.9 % — ABNORMAL HIGH (ref 11.5–15.5)
WBC: 7.6 10*3/uL (ref 4.0–10.5)

## 2011-02-21 LAB — HEPATIC FUNCTION PANEL
ALT: 20 U/L (ref 0–35)
AST: 68 U/L — ABNORMAL HIGH (ref 0–37)
Albumin: 2.9 g/dL — ABNORMAL LOW (ref 3.5–5.2)
Bilirubin, Direct: <0.1 mg/dL (ref 0.0–0.3)

## 2011-02-21 LAB — HEMOGLOBIN A1C
Hgb A1c MFr Bld: 7.6 % — ABNORMAL HIGH (ref <5.7)
Mean Plasma Glucose: 171 mg/dL — ABNORMAL HIGH (ref <117)

## 2011-02-21 LAB — MAGNESIUM: Magnesium: 2.1 mg/dL (ref 1.5–2.5)

## 2011-02-21 MED ORDER — INSULIN ASPART 100 UNIT/ML ~~LOC~~ SOLN
0.0000 [IU] | SUBCUTANEOUS | Status: DC
  Administered 2011-02-21: 3 [IU] via SUBCUTANEOUS
  Administered 2011-02-22: 5 [IU] via SUBCUTANEOUS
  Administered 2011-02-22 (×2): 3 [IU] via SUBCUTANEOUS
  Filled 2011-02-21: qty 3

## 2011-02-21 MED ORDER — EXENATIDE 5 MCG/0.02ML ~~LOC~~ SOPN
5.0000 ug | PEN_INJECTOR | Freq: Three times a day (TID) | SUBCUTANEOUS | Status: DC
  Administered 2011-02-21 – 2011-02-23 (×5): 5 ug via SUBCUTANEOUS

## 2011-02-21 MED ORDER — HEPARIN BOLUS VIA INFUSION
2000.0000 [IU] | Freq: Once | INTRAVENOUS | Status: AC
  Administered 2011-02-21: 2000 [IU] via INTRAVENOUS
  Filled 2011-02-21: qty 2000

## 2011-02-21 MED ORDER — SODIUM CHLORIDE 0.9 % IV SOLN: 250.0000 mL | INTRAVENOUS | Status: DC | PRN

## 2011-02-21 MED ORDER — ASPIRIN 81 MG PO CHEW
324.0000 mg | CHEWABLE_TABLET | ORAL | Status: AC
  Administered 2011-02-22: 324 mg via ORAL
  Filled 2011-02-21: qty 4

## 2011-02-21 MED ORDER — SODIUM CHLORIDE 0.9 % IV SOLN
1.0000 mL/kg/h | INTRAVENOUS | Status: DC
  Administered 2011-02-21 – 2011-02-22 (×2): 1 mL/kg/h via INTRAVENOUS
  Administered 2011-02-22: 0.997 mL/kg/h via INTRAVENOUS

## 2011-02-21 MED ORDER — DIAZEPAM 5 MG PO TABS
5.0000 mg | ORAL_TABLET | ORAL | Status: AC
  Administered 2011-02-22: 5 mg via ORAL
  Filled 2011-02-21: qty 1

## 2011-02-21 MED ORDER — SODIUM CHLORIDE 0.9 % IJ SOLN
3.0000 mL | INTRAMUSCULAR | Status: DC | PRN
  Administered 2011-02-21: 10:00:00 via INTRAVENOUS

## 2011-02-21 MED ORDER — SODIUM CHLORIDE 0.9 % IJ SOLN
3.0000 mL | Freq: Two times a day (BID) | INTRAMUSCULAR | Status: DC
  Administered 2011-02-21: 3 mL via INTRAVENOUS
  Administered 2011-02-22: 10 mL via INTRAVENOUS

## 2011-02-21 NOTE — Progress Notes (Signed)
ANTICOAGULATION CONSULT NOTE - Initial Consult  Pharmacy Consult for Heparin Indication: chest pain/ACS  Allergies  Allergen Reactions  . Aspirin     REACTION: use only enteric coated  . Ciprofloxacin     REACTION: Felt heart palps and "weird"  . Codeine     REACTION: nausea/vomiting  . Fluvastatin Sodium   . Penicillins     REACTION: rash  . Simvastatin Hives  . Sulfonamide Derivatives Nausea Only    Patient Measurements: Height: 5\' 6"  (167.6 cm) Weight: 201 lb 4.5 oz (91.3 kg) IBW/kg (Calculated) : 59.3  Heparin Dosing Weight: 78.8 kg  Vital Signs: Temp: 97.9 F (36.6 C) (12/02 0000) Temp src: Oral (12/02 0000) BP: 152/57 mmHg (12/01 2300) Pulse Rate: 80  (12/01 2300)  Labs:  Basename 02/21/11 0230 02/20/11 2334 02/20/11 2232 02/20/11 1812 02/20/11 1319 02/20/11 1230  HGB -- -- -- -- 10.9* 10.5*  HCT -- -- -- -- 32.0* 33.7*  PLT -- -- -- -- -- 122*  APTT -- -- -- -- -- --  LABPROT -- -- -- 14.6 -- --  INR -- -- -- 1.12 -- --  HEPARINUNFRC 0.19* -- -- -- -- --  CREATININE -- -- -- -- 1.20* --  CKTOTAL -- 97 97 -- -- --  CKMB -- 3.5 3.7 -- -- --  TROPONINI -- <0.30 <0.30 -- -- --   Estimated Creatinine Clearance: 53.2 ml/min (by C-G formula based on Cr of 1.2).  Medical History: Past Medical History  Diagnosis Date  . Angina at rest   . Coronary artery disease   . Hypertension   . Diabetes mellitus   . Renal disorder   . CHF (congestive heart failure)   . Arthritis   . Cirrhosis   . CAD (coronary artery disease) 02/20/2011  . CHRONIC KIDNEY DISEASE STAGE III (MODERATE) 05/12/2009  . DM 05/12/2009  . Ulcer of lower limb, unspecified 05/12/2009  . Cataract 02/20/2011  . NAFLD (nonalcoholic fatty liver disease) 16/03/958  . Fibromyalgia   . Glaucoma   . A-fib     Assessment: 65 y.o. Female with chest pain  Goal of Therapy:  Heparin level 0.3-0.7 units/ml   Plan:  Heparin 2000 units IV bolus, then increase heparin 1250 units/hr. Check 8 hr Heparin  level   Ayisha Pol, Gary Fleet 02/21/2011,3:57 AM

## 2011-02-21 NOTE — Progress Notes (Signed)
ANTICOAGULATION CONSULT NOTE - Initial Consult  Pharmacy Consult for Heparin Indication: chest pain/ACS  Allergen  . Aspirin-use only enteric coated  . Ciprofloxacin  . Codeine-nausea/vomiting  . Fluvastatin Sodium  . Penicillins-rash  . Simvastatin-Hives  . Sulfonamide Derivatives-Nausea Only    Patient Measurements: Height: 5\' 6"  (167.6 cm) Weight: 201 lb 4.5 oz (91.3 kg) IBW/kg (Calculated) : 59.3  Heparin Dosing Weight: 78.8 kg  Vital Signs: Temp: 97.9 F (36.6 C) (12/02 0752) Temp src: Oral (12/02 0752) BP: 117/51 mmHg (12/02 0600) Pulse Rate: 60  (12/02 0600)  Labs:  Basename 02/21/11 0940 02/21/11 0530 02/21/11 0500 02/21/11 0230 02/20/11 2334 02/20/11 2232 02/20/11 1812 02/20/11 1319 02/20/11 1230  HGB -- -- 9.9* -- -- -- -- 10.9* --  HCT -- -- 31.9* -- -- -- -- 32.0* 33.7*  PLT -- -- 137* -- -- -- -- -- 122*  APTT -- -- -- -- -- -- -- -- --  LABPROT -- -- -- -- -- -- 14.6 -- --  INR -- -- -- -- -- -- 1.12 -- --  HEPARINUNFRC 0.39 -- -- 0.19* -- -- -- -- --  CREATININE -- -- 1.03 -- -- -- -- 1.20* --  CKTOTAL -- 82 -- -- 97 97 -- -- --  CKMB -- 2.8 -- -- 3.5 3.7 -- -- --  TROPONINI -- <0.30 -- -- <0.30 <0.30 -- -- --   Estimated Creatinine Clearance: 62 ml/min (by C-G formula based on Cr of 1.03).  Medical History: Past Medical History  Diagnosis Date  . Angina at rest   . Coronary artery disease   . Hypertension   . Diabetes mellitus   . Renal disorder   . CHF (congestive heart failure)   . Arthritis   . Cirrhosis   . CAD (coronary artery disease) 02/20/2011  . CHRONIC KIDNEY DISEASE STAGE III (MODERATE) 05/12/2009  . DM 05/12/2009  . Ulcer of lower limb, unspecified 05/12/2009  . Cataract 02/20/2011  . NAFLD (nonalcoholic fatty liver disease) 52/10/4130  . Fibromyalgia   . Glaucoma   . A-fib   . Thrombocytopenia 02/21/2011    Assessment: 65 y.o. Female with chest pain.  She has a history of CAD/HTN/CHF/Afib.  Heparin Xa level today = 0.39 and is  within the desired goal range of 0.3-0.7.  No bleeding complications noted with therapy.  Goal of Therapy:  Heparin level 0.3-0.7 units/ml   Plan:  1.  Will continue current heparin rate. 2.  F/u AM heparin level and adjust. 3.  Monitor for s/s of infection. 4.  F/u after cath on Monday.   Nadara Mustard, PharmD. MS 02/21/2011,10:32 AM

## 2011-02-21 NOTE — Progress Notes (Signed)
Subjective: No SOB  Objective: Vital signs in last 24 hours: Temp:  [97.6 F (36.4 C)-98.1 F (36.7 C)] 97.6 F (36.4 C) (12/02 0400) Pulse Rate:  [60-83] 60  (12/02 0600) Resp:  [12-23] 13  (12/02 0600) BP: (93-152)/(38-98) 117/51 mmHg (12/02 0600) SpO2:  [96 %-100 %] 96 % (12/02 0600) Weight:  [89.812 kg (198 lb)-91.3 kg (201 lb 4.5 oz)] 201 lb 4.5 oz (91.3 kg) (12/01 2200) Weight change:  Last BM Date: 02/19/11 Intake/Output from previous day:  Today +244 12/01 0701 - 12/02 0700 In: 424.2 [I.V.:424.2] Out: 180 [Urine:180] Intake/Output this shift:    PE: General: Heart: Lungs: Abd: Ext:   IV heparin, IV NTG @ .  Lab Results:  Basename 02/21/11 0500 02/20/11 1319 02/20/11 1230  WBC PENDING -- 6.0  HGB 9.9* 10.9* --  HCT 31.9* 32.0* --  PLT PENDING -- 122*   BMET  Basename 02/21/11 0500 02/20/11 1319  NA 136 141  K 4.6 3.3*  CL 102 102  CO2 26 --  GLUCOSE 124* 205*  BUN 21 21  CREATININE 1.03 1.20*  CALCIUM 8.7 --    Basename 02/21/11 0530 02/20/11 2334  TROPONINI <0.30 <0.30    Lab Results  Component Value Date   CHOL 95 02/21/2011   HDL 28* 02/21/2011   LDLCALC 29 02/21/2011   TRIG 190* 02/21/2011   CHOLHDL 3.4 02/21/2011   Lab Results  Component Value Date   HGBA1C  Value: 8.0 (NOTE)                                                                       According to the ADA Clinical Practice Recommendations for 2011, when HbA1c is used as a screening test:   >=6.5%   Diagnostic of Diabetes Mellitus           (if abnormal result  is confirmed)  5.7-6.4%   Increased risk of developing Diabetes Mellitus  References:Diagnosis and Classification of Diabetes Mellitus,Diabetes Care,2011,34(Suppl 1):S62-S69 and Standards of Medical Care in         Diabetes - 2011,Diabetes Care,2011,34  (Suppl 1):S11-S61.* 10/04/2009     Lab Results  Component Value Date   TSH 2.697 10/04/2009    Hepatic Function Panel  Basename 02/21/11 0500  PROT 6.7  ALBUMIN 2.9*    AST 68*  ALT 20  ALKPHOS 90  BILITOT 0.5  BILIDIR <0.1  IBILI NOT CALCULATED    Basename 02/21/11 0500  CHOL 95   No results found for this basename: PROTIME in the last 72 hours    EKG: Orders placed during the hospital encounter of 02/20/11  . ED EKG  . ED EKG  . EKG 12-LEAD  . EKG 12-LEAD  . EKG 12-LEAD    Studies/Results: Dg Chest 2 View  02/20/2011  *RADIOLOGY REPORT*  Clinical Data: Chest pain, hypertension, diabetes  CHEST - 2 VIEW  Comparison: 12/05/2010  Findings: Previous coronary bypass changes noted.  Stable borderline cardiomegaly and vascular prominence.  Calcified stable granuloma in the right lower lobe.  No new edema, CHF, consolidation, pneumonia, effusion or pneumothorax.  Trachea midline.  Degenerative changes of the spine.  IMPRESSION: Stable postoperative findings.  Remote granulomatous disease.  No acute process.  Original Report Authenticated  By: M. Ruel Favors, M.D.    Medications: I have reviewed the patient's current medications.  Assessment/Plan: Patient Active Problem List  Diagnoses  . DM  . ACUTE DIASTOLIC HEART FAILURE  . CHRONIC KIDNEY DISEASE STAGE III (MODERATE)  . Ulcer of lower limb, unspecified  . Acute osteomyelitis, ankle and foot  . Angina at rest  . CAD (coronary artery disease)  . Cataract  . NAFLD (nonalcoholic fatty liver disease)  . Thrombocytopenia  Anemia  PLAN:  Neg. MI, plan for cath on Monday. DM controlled.Monitor anemia, check anemia panel. MD to see.     LOS: 1 day   INGOLD,LAURA R 02/21/2011, 7:36 AM Agree with note written by Nada Boozer RNP  No recurrent CP. Enz. Neg. On IV hep/NTG. Exam benign. For cath tomorrow.  Runell Gess 02/21/2011 10:11 AM

## 2011-02-22 ENCOUNTER — Encounter (HOSPITAL_COMMUNITY)
Admission: EM | Disposition: A | Discharge status: Home / Self Care | Payer: Self-pay | Source: Home / Self Care | Attending: Cardiovascular Disease

## 2011-02-22 ENCOUNTER — Encounter (HOSPITAL_COMMUNITY): Payer: Self-pay | Admitting: Internal Medicine

## 2011-02-22 HISTORY — PX: LEFT HEART CATHETERIZATION WITH CORONARY/GRAFT ANGIOGRAM: SHX5450

## 2011-02-22 LAB — BASIC METABOLIC PANEL
CO2: 27 mEq/L (ref 19–32)
Calcium: 9.2 mg/dL (ref 8.4–10.5)
Creatinine, Ser: 1.12 mg/dL — ABNORMAL HIGH (ref 0.50–1.10)
GFR calc Af Amer: 58 mL/min — ABNORMAL LOW (ref >90)
GFR calc non Af Amer: 50 mL/min — ABNORMAL LOW (ref >90)
Sodium: 137 mEq/L (ref 135–145)

## 2011-02-22 LAB — RETICULOCYTES
RBC.: 3.8 MIL/uL — ABNORMAL LOW (ref 3.87–5.11)
Retic Count, Absolute: 148.2 10*3/uL (ref 19.0–186.0)
Retic Ct Pct: 3.9 % — ABNORMAL HIGH (ref 0.4–3.1)

## 2011-02-22 LAB — GLUCOSE, CAPILLARY
Glucose-Capillary: 141 mg/dL — ABNORMAL HIGH (ref 70–99)
Glucose-Capillary: 98 mg/dL (ref 70–99)
Glucose-Capillary: 282 mg/dL — ABNORMAL HIGH (ref 70–99)

## 2011-02-22 LAB — CBC
MCV: 81.8 fL (ref 78.0–100.0)
Platelets: 112 10*3/uL — ABNORMAL LOW (ref 150–400)
RDW: 17.7 % — ABNORMAL HIGH (ref 11.5–15.5)
WBC: 5.2 10*3/uL (ref 4.0–10.5)

## 2011-02-22 LAB — IRON AND TIBC: UIBC: 256 ug/dL (ref 125–400)

## 2011-02-22 LAB — PLATELET INHIBITION P2Y12: Platelet Function  P2Y12: 345 [PRU] (ref 194–418)

## 2011-02-22 LAB — HEPARIN LEVEL (UNFRACTIONATED): Heparin Unfractionated: 0.23 IU/mL — ABNORMAL LOW (ref 0.30–0.70)

## 2011-02-22 LAB — FERRITIN: Ferritin: 154 ng/mL (ref 10–291)

## 2011-02-22 SURGERY — LEFT HEART CATHETERIZATION WITH CORONARY/GRAFT ANGIOGRAM
Anesthesia: LOCAL | Site: Groin

## 2011-02-22 MED ORDER — SODIUM CHLORIDE 0.9 % IV SOLN: 1.0000 mL/kg/h | INTRAVENOUS | Status: AC

## 2011-02-22 MED ORDER — HEPARIN (PORCINE) IN NACL 2-0.9 UNIT/ML-% IJ SOLN
INTRAMUSCULAR | Status: AC
  Filled 2011-02-22: qty 2000

## 2011-02-22 MED ORDER — NITROGLYCERIN 0.2 MG/ML ON CALL CATH LAB
INTRAVENOUS | Status: AC
  Filled 2011-02-22: qty 1

## 2011-02-22 MED ORDER — LIDOCAINE HCL (PF) 1 % IJ SOLN
INTRAMUSCULAR | Status: AC
  Filled 2011-02-22: qty 30

## 2011-02-22 NOTE — Interval H&P Note (Signed)
History and Physical Interval Note:  02/22/2011 10:23 AM  Brenda [name]  has presented today for surgery, with the diagnosis of chest pain  The various methods of treatment have been discussed with the patient and family. After consideration of risks, benefits and other options for treatment, the patient has consented to  Procedure(s): LEFT HEART CATHETERIZATION WITH CORONARY ANGIOGRAM as a surgical intervention .  The patients' history has been reviewed, patient examined, no change in status, stable for surgery.  I have reviewed the patients' chart and labs.  Questions were answered to the patient's satisfaction.     HILTY,Kenneth C

## 2011-02-22 NOTE — Progress Notes (Signed)
Inpatient Diabetes Program Recommendations  AACE/ADA: New Consensus Statement on Inpatient Glycemic Control (2009)  Target Ranges:  Prepandial:   less than 140 mg/dL      Peak postprandial:   less than 180 mg/dL (1-2 hours)      Critically ill patients:  140 - 180 mg/dL   Reason for Visit: Hyperglycemia   Inpatient Diabetes Program Recommendations Correction (SSI): increase to Resistant scale   Note: CBGs 249, 268, 244

## 2011-02-22 NOTE — H&P (View-Only) (Signed)
[facility]  DAILY PROGRESS NOTE   Subjective:  No chest pain overnight. No sure if chest wall pain from fibromyalgia or related to CAD.  Her blood glucose is high today, she is on sensitive scale insulin.  Objective:  Temp:  [97.7 F (36.5 C)-98.9 F (37.2 C)] 97.7 F (36.5 C) (12/03 0733) Resp:  [13-20] 15  (12/03 0700) BP: (121-147)/(40-60) 147/60 mmHg (12/03 0733) SpO2:  [95 %-97 %] 97 % (12/03 0400) Weight:  [90.7 kg (199 lb 15.3 oz)-91.2 kg (201 lb 1 oz)] 201 lb 1 oz (91.2 kg) (12/03 0500) Weight change: 0.888 kg (1 lb 15.3 oz)  Intake/Output from previous day: 12/02 0701 - 12/03 0700 In: 2748.3 [P.O.:1140; I.V.:1608.3] Out: 2816 [Urine:2815; Stool:1]  Intake/Output from this shift: Total I/O In: 222.5 [I.V.:222.5] Out: 550 [Urine:550]  Medications: Current Facility-Administered Medications  Medication Dose Route Frequency Provider Last Rate Last Dose  . 0.9 %  sodium chloride infusion   Intravenous Continuous Laura R Ingold, NP 10 mL/hr at 02/21/11 1807 10 mL at 02/21/11 1807  . 0.9 %  sodium chloride infusion  250 mL Intravenous PRN Laura R Ingold, NP      . 0.9 %  sodium chloride infusion  1 mL/kg/hr Intravenous Continuous Laura R Ingold, NP 91.3 mL/hr at 02/22/11 0249 1 mL/kg/hr at 02/22/11 0249  . acetaminophen (TYLENOL) tablet 500 mg  500 mg Oral Q6H PRN Laura R Ingold, NP      . acetaminophen (TYLENOL) tablet 650 mg  650 mg Oral Q4H PRN Laura R Ingold, NP      . ALPRAZolam (XANAX) tablet 0.25 mg  0.25 mg Oral BID PRN Laura R Ingold, NP      . amLODipine (NORVASC) tablet 2.5 mg  2.5 mg Oral Daily Laura R Ingold, NP   2.5 mg at 02/22/11 0911  . aspirin chewable tablet 324 mg  324 mg Oral Pre-Cath Laura R Ingold, NP   324 mg at 02/22/11 0517  . aspirin EC tablet 81 mg  81 mg Oral Daily Laura R Ingold, NP   81 mg at 02/21/11 0948  . bimatoprost (LUMIGAN) 0.03 % ophthalmic solution 1 drop  1 drop Both Eyes QHS Laura R Ingold, NP   1 drop  at 02/21/11 2200  . brimonidine (ALPHAGAN) 0.2 % ophthalmic solution 1 drop  1 drop Both Eyes TID Laura R Ingold, NP   1 drop at 02/22/11 0911  . bumetanide (BUMEX) tablet 2 mg  2 mg Oral Daily Laura R Ingold, NP   2 mg at 02/21/11 0947  . diazepam (VALIUM) tablet 5 mg  5 mg Oral On Call Laura R Ingold, NP      . docusate sodium (COLACE) capsule 200 mg  200 mg Oral QHS Laura R Ingold, NP      . exenatide (BYETTA) injection 5 mcg  5 mcg Subcutaneous TID WC Laura R Ingold, NP   5 mcg at 02/21/11 1715  . fenofibrate tablet 160 mg  160 mg Oral Daily Laura R Ingold, NP   160 mg at 02/22/11 0911  . fluticasone (FLONASE) 50 MCG/ACT nasal spray 2 spray  2 spray Each Nare Daily Laura R Ingold, NP   2 spray at 02/22/11 0912  . heparin ADULT infusion 100 units/ml (25000 units/250 ml)  1,250 Units/hr Intravenous Continuous Gregory Vernon Abbott, PHARMD 12.5 mL/hr at 02/22/11 0910 1,250 Units/hr at 02/22/11 0910  . insulin aspart (novoLOG) injection 0-15 Units  0-15 Units Subcutaneous TID WC   Laura R Ingold, NP   8 Units at 02/21/11 1718  . insulin aspart (novoLOG) injection 0-9 Units  0-9 Units Subcutaneous Q4H Laura R Ingold, NP   3 Units at 02/22/11 0913  . insulin detemir (LEVEMIR) injection 100 Units  100 Units Subcutaneous BID Laura R Ingold, NP   50 Units at 02/22/11 0916  . loratadine (CLARITIN) tablet 10 mg  10 mg Oral Daily Laura R Ingold, NP   10 mg at 02/22/11 0911  . metaxalone (SKELAXIN) tablet 800 mg  800 mg Oral Daily Laura R Ingold, NP      . nitroGLYCERIN (NITROSTAT) SL tablet 0.4 mg  0.4 mg Sublingual Q5 min PRN Laura R Ingold, NP      . nitroGLYCERIN 0.2 mg/mL in dextrose 5 % infusion  5 mcg/min Intravenous Titrated Laura R Ingold, NP 1.5 mL/hr at 02/22/11 0700 5 mcg/min at 02/22/11 0700  . omega-3 acid ethyl esters (LOVAZA) capsule 1 g  1 g Oral QID Laura R Ingold, NP   1 g at 02/22/11 0911  . ondansetron (ZOFRAN) injection 4 mg  4 mg Intravenous Q6H PRN Laura R Ingold, NP      .  pantoprazole (PROTONIX) EC tablet 80 mg  80 mg Oral Q1200 Laura R Ingold, NP   80 mg at 02/21/11 0148  . rosuvastatin (CRESTOR) tablet 20 mg  20 mg Oral Daily Laura R Ingold, NP   20 mg at 02/22/11 0911  . silver sulfADIAZINE (SILVADENE) 1 % cream 1 application  1 application Topical Daily Laura R Ingold, NP   1 application at 02/21/11 1000  . simethicone (MYLICON) chewable tablet 80 mg  80 mg Oral BID Laura R Ingold, NP   80 mg at 02/22/11 0912  . sodium chloride 0.9 % injection 3 mL  3 mL Intravenous Q12H Laura R Ingold, NP   10 mL at 02/22/11 0917  . sodium chloride 0.9 % injection 3 mL  3 mL Intravenous PRN Laura R Ingold, NP      . sotalol (BETAPACE) tablet 80 mg  80 mg Oral BID Laura R Ingold, NP   80 mg at 02/22/11 0910  . zolpidem (AMBIEN) tablet 5 mg  5 mg Oral QHS PRN Laura R Ingold, NP        Physical Exam: General appearance: alert and no distress Neck: no adenopathy, no carotid bruit, no JVD, supple, symmetrical, trachea midline and thyroid not enlarged, symmetric, no tenderness/mass/nodules Lungs: clear to auscultation bilaterally Heart: regular rate and rhythm, S1, S2 normal, no murmur, click, rub or gallop Abdomen: soft, non-tender; bowel sounds normal; no masses,  no organomegaly Extremities: extremities normal, atraumatic, no cyanosis or edema Pulses: 2+ and symmetric Neurologic: Grossly normal  Lab Results: Results for orders placed during the hospital encounter of 02/20/11 (from the past 48 hour(s))  CBC     Status: Abnormal   Collection Time   02/20/11 12:30 PM      Component Value Range Comment   WBC 6.0  4.0 - 10.5 (K/uL)    RBC 4.14  3.87 - 5.11 (MIL/uL)    Hemoglobin 10.5 (*) 12.0 - 15.0 (g/dL)    HCT 33.7 (*) 36.0 - 46.0 (%)    MCV 81.4  78.0 - 100.0 (fL)    MCH 25.4 (*) 26.0 - 34.0 (pg)    MCHC 31.2  30.0 - 36.0 (g/dL)    RDW 17.5 (*) 11.5 - 15.5 (%)    Platelets 122 (*) 150 - 400 (K/uL)     DIFFERENTIAL     Status: Normal   Collection Time   02/20/11 12:30  PM      Component Value Range Comment   Neutrophils Relative 64  43 - 77 (%)    Neutro Abs 3.8  1.7 - 7.7 (K/uL)    Lymphocytes Relative 24  12 - 46 (%)    Lymphs Abs 1.4  0.7 - 4.0 (K/uL)    Monocytes Relative 8  3 - 12 (%)    Monocytes Absolute 0.5  0.1 - 1.0 (K/uL)    Eosinophils Relative 4  0 - 5 (%)    Eosinophils Absolute 0.2  0.0 - 0.7 (K/uL)    Basophils Relative 0  0 - 1 (%)    Basophils Absolute 0.0  0.0 - 0.1 (K/uL)   POCT I-STAT TROPONIN I     Status: Normal   Collection Time   02/20/11  1:16 PM      Component Value Range Comment   Troponin i, poc 0.02  0.00 - 0.08 (ng/mL)    Comment 3            POCT I-STAT, CHEM 8     Status: Abnormal   Collection Time   02/20/11  1:19 PM      Component Value Range Comment   Sodium 141  135 - 145 (mEq/L)    Potassium 3.3 (*) 3.5 - 5.1 (mEq/L)    Chloride 102  96 - 112 (mEq/L)    BUN 21  6 - 23 (mg/dL)    Creatinine, Ser 1.20 (*) 0.50 - 1.10 (mg/dL)    Glucose, Bld 205 (*) 70 - 99 (mg/dL)    Calcium, Ion 1.15  1.12 - 1.32 (mmol/L)    TCO2 27  0 - 100 (mmol/L)    Hemoglobin 10.9 (*) 12.0 - 15.0 (g/dL)    HCT 32.0 (*) 36.0 - 46.0 (%)   GLUCOSE, CAPILLARY     Status: Abnormal   Collection Time   02/20/11  5:47 PM      Component Value Range Comment   Glucose-Capillary 108 (*) 70 - 99 (mg/dL)    Comment 1 Documented in Chart      Comment 2 Notify RN     PROTIME-INR     Status: Normal   Collection Time   02/20/11  6:12 PM      Component Value Range Comment   Prothrombin Time 14.6  11.6 - 15.2 (seconds)    INR 1.12  0.00 - 1.49    MRSA PCR SCREENING     Status: Normal   Collection Time   02/20/11 10:00 PM      Component Value Range Comment   MRSA by PCR NEGATIVE  NEGATIVE    GLUCOSE, CAPILLARY     Status: Abnormal   Collection Time   02/20/11 10:08 PM      Component Value Range Comment   Glucose-Capillary 188 (*) 70 - 99 (mg/dL)   CARDIAC PANEL(CRET KIN+CKTOT+MB+TROPI)     Status: Normal   Collection Time   02/20/11 10:32 PM       Component Value Range Comment   Total CK 97  7 - 177 (U/L)    CK, MB 3.7  0.3 - 4.0 (ng/mL)    Troponin I <0.30  <0.30 (ng/mL)    Relative Index RELATIVE INDEX IS INVALID  0.0 - 2.5    CARDIAC PANEL(CRET KIN+CKTOT+MB+TROPI)     Status: Normal   Collection Time   02/20/11 11:34 PM        Component Value Range Comment   Total CK 97  7 - 177 (U/L)    CK, MB 3.5  0.3 - 4.0 (ng/mL)    Troponin I <0.30  <0.30 (ng/mL)    Relative Index RELATIVE INDEX IS INVALID  0.0 - 2.5    HEPARIN LEVEL (UNFRACTIONATED)     Status: Abnormal   Collection Time   02/21/11  2:30 AM      Component Value Range Comment   Heparin Unfractionated 0.19 (*) 0.30 - 0.70 (IU/mL)   TSH     Status: Normal   Collection Time   02/21/11  2:30 AM      Component Value Range Comment   TSH 2.277  0.350 - 4.500 (uIU/mL)   HEMOGLOBIN A1C     Status: Abnormal   Collection Time   02/21/11  2:30 AM      Component Value Range Comment   Hemoglobin A1C 7.6 (*) <5.7 (%)    Mean Plasma Glucose 171 (*) <117 (mg/dL)   CBC     Status: Abnormal   Collection Time   02/21/11  5:00 AM      Component Value Range Comment   WBC 7.6  4.0 - 10.5 (K/uL) WHITE COUNT CONFIRMED ON SMEAR   RBC 3.90  3.87 - 5.11 (MIL/uL)    Hemoglobin 9.9 (*) 12.0 - 15.0 (g/dL)    HCT 31.9 (*) 36.0 - 46.0 (%)    MCV 81.8  78.0 - 100.0 (fL)    MCH 25.4 (*) 26.0 - 34.0 (pg)    MCHC 31.0  30.0 - 36.0 (g/dL)    RDW 17.9 (*) 11.5 - 15.5 (%)    Platelets 137 (*) 150 - 400 (K/uL) PLATELET COUNT CONFIRMED BY SMEAR  BASIC METABOLIC PANEL     Status: Abnormal   Collection Time   02/21/11  5:00 AM      Component Value Range Comment   Sodium 136  135 - 145 (mEq/L)    Potassium 4.6  3.5 - 5.1 (mEq/L)    Chloride 102  96 - 112 (mEq/L)    CO2 26  19 - 32 (mEq/L)    Glucose, Bld 124 (*) 70 - 99 (mg/dL)    BUN 21  6 - 23 (mg/dL)    Creatinine, Ser 1.03  0.50 - 1.10 (mg/dL)    Calcium 8.7  8.4 - 10.5 (mg/dL)    GFR calc non Af Amer 56 (*) >90 (mL/min)    GFR calc Af Amer  65 (*) >90 (mL/min)   HEPATIC FUNCTION PANEL     Status: Abnormal   Collection Time   02/21/11  5:00 AM      Component Value Range Comment   Total Protein 6.7  6.0 - 8.3 (g/dL)    Albumin 2.9 (*) 3.5 - 5.2 (g/dL)    AST 68 (*) 0 - 37 (U/L) HEMOLYSIS AT THIS LEVEL MAY AFFECT RESULT   ALT 20  0 - 35 (U/L)    Alkaline Phosphatase 90  39 - 117 (U/L) HEMOLYSIS AT THIS LEVEL MAY AFFECT RESULT   Total Bilirubin 0.5  0.3 - 1.2 (mg/dL)    Bilirubin, Direct <0.1  0.0 - 0.3 (mg/dL)    Indirect Bilirubin NOT CALCULATED  0.3 - 0.9 (mg/dL)   LIPID PANEL     Status: Abnormal   Collection Time   02/21/11  5:00 AM      Component Value Range Comment   Cholesterol 95  0 - 200 (mg/dL)      Triglycerides 190 (*) <150 (mg/dL)    HDL 28 (*) >39 (mg/dL)    Total CHOL/HDL Ratio 3.4      VLDL 38  0 - 40 (mg/dL)    LDL Cholesterol 29  0 - 99 (mg/dL)   CARDIAC PANEL(CRET KIN+CKTOT+MB+TROPI)     Status: Normal   Collection Time   02/21/11  5:30 AM      Component Value Range Comment   Total CK 82  7 - 177 (U/L)    CK, MB 2.8  0.3 - 4.0 (ng/mL)    Troponin I <0.30  <0.30 (ng/mL)    Relative Index RELATIVE INDEX IS INVALID  0.0 - 2.5    MAGNESIUM     Status: Normal   Collection Time   02/21/11  5:30 AM      Component Value Range Comment   Magnesium 2.1  1.5 - 2.5 (mg/dL)   GLUCOSE, CAPILLARY     Status: Abnormal   Collection Time   02/21/11  7:54 AM      Component Value Range Comment   Glucose-Capillary 130 (*) 70 - 99 (mg/dL)    Comment 1 Notify RN     HEPARIN LEVEL (UNFRACTIONATED)     Status: Normal   Collection Time   02/21/11  9:40 AM      Component Value Range Comment   Heparin Unfractionated 0.39  0.30 - 0.70 (IU/mL)   GLUCOSE, CAPILLARY     Status: Abnormal   Collection Time   02/21/11 12:19 PM      Component Value Range Comment   Glucose-Capillary 293 (*) 70 - 99 (mg/dL)   GLUCOSE, CAPILLARY     Status: Abnormal   Collection Time   02/21/11  4:39 PM      Component Value Range Comment    Glucose-Capillary 260 (*) 70 - 99 (mg/dL)    Comment 1 Notify RN     GLUCOSE, CAPILLARY     Status: Abnormal   Collection Time   02/21/11  9:45 PM      Component Value Range Comment   Glucose-Capillary 234 (*) 70 - 99 (mg/dL)   GLUCOSE, CAPILLARY     Status: Abnormal   Collection Time   02/21/11 11:26 PM      Component Value Range Comment   Glucose-Capillary 249 (*) 70 - 99 (mg/dL)   GLUCOSE, CAPILLARY     Status: Abnormal   Collection Time   02/22/11  4:26 AM      Component Value Range Comment   Glucose-Capillary 268 (*) 70 - 99 (mg/dL)   HEPARIN LEVEL (UNFRACTIONATED)     Status: Abnormal   Collection Time   02/22/11  7:20 AM      Component Value Range Comment   Heparin Unfractionated 0.23 (*) 0.30 - 0.70 (IU/mL)   RETICULOCYTES     Status: Abnormal   Collection Time   02/22/11  7:20 AM      Component Value Range Comment   Retic Ct Pct 3.9 (*) 0.4 - 3.1 (%)    RBC. 3.80 (*) 3.87 - 5.11 (MIL/uL)    Retic Count, Manual 148.2  19.0 - 186.0 (K/uL)   BASIC METABOLIC PANEL     Status: Abnormal   Collection Time   02/22/11  7:20 AM      Component Value Range Comment   Sodium 137  135 - 145 (mEq/L)    Potassium 3.7  3.5 - 5.1 (mEq/L)    Chloride 101  96 - 112 (mEq/L)      CO2 27  19 - 32 (mEq/L)    Glucose, Bld 276 (*) 70 - 99 (mg/dL)    BUN 23  6 - 23 (mg/dL)    Creatinine, Ser 1.12 (*) 0.50 - 1.10 (mg/dL)    Calcium 9.2  8.4 - 10.5 (mg/dL)    GFR calc non Af Amer 50 (*) >90 (mL/min)    GFR calc Af Amer 58 (*) >90 (mL/min)   CBC     Status: Abnormal   Collection Time   02/22/11  7:20 AM      Component Value Range Comment   WBC 5.2  4.0 - 10.5 (K/uL)    RBC 3.80 (*) 3.87 - 5.11 (MIL/uL)    Hemoglobin 9.6 (*) 12.0 - 15.0 (g/dL)    HCT 31.1 (*) 36.0 - 46.0 (%)    MCV 81.8  78.0 - 100.0 (fL)    MCH 25.3 (*) 26.0 - 34.0 (pg)    MCHC 30.9  30.0 - 36.0 (g/dL)    RDW 17.7 (*) 11.5 - 15.5 (%)    Platelets 112 (*) 150 - 400 (K/uL) PLATELET COUNT CONFIRMED BY SMEAR  PLATELET INHIBITION  P2Y12     Status: Normal   Collection Time   02/22/11  7:20 AM      Component Value Range Comment   Platelet Function  P2Y12 345  194 - 418 (PRU)   GLUCOSE, CAPILLARY     Status: Abnormal   Collection Time   02/22/11  7:41 AM      Component Value Range Comment   Glucose-Capillary 244 (*) 70 - 99 (mg/dL)     Imaging: Dg Chest 2 View  02/20/2011  *RADIOLOGY REPORT*  Clinical Data: Chest pain, hypertension, diabetes  CHEST - 2 VIEW  Comparison: 12/05/2010  Findings: Previous coronary bypass changes noted.  Stable borderline cardiomegaly and vascular prominence.  Calcified stable granuloma in the right lower lobe.  No new edema, CHF, consolidation, pneumonia, effusion or pneumothorax.  Trachea midline.  Degenerative changes of the spine.  IMPRESSION: Stable postoperative findings.  Remote granulomatous disease.  No acute process.  Original Report Authenticated By: M. TREVOR SHICK, M.D.    Assessment:  1. Principal Problem: 2.  *Angina at rest 3. Active Problems: 4.  CAD (coronary artery disease) 5.  Cataract 6.  NAFLD (nonalcoholic fatty liver disease) 7.  Thrombocytopenia 8.   Plan:  1. LHC today with grafts .. Femoral approach. She is agreeable and has provided informed consent.  Time Spent Directly with Patient:  15 minutes  Length of Stay:  LOS: 2 days   Lukas Pelcher C. Madellyn Denio, MD Attending Cardiologist [facility]  Terrilyn Tyner C 02/22/2011, 9:31 AM    

## 2011-02-22 NOTE — Progress Notes (Signed)
ANTICOAGULATION CONSULT NOTE - Follow Up Consult  Pharmacy Consult for Heparin Indication: chest pain/ACS  Allergen  . Aspirin-use only enteric coated  . Ciprofloxacin  . Codeine-nausea/vomiting  . Fluvastatin Sodium  . Penicillins-rash  . Simvastatin-Hives  . Sulfonamide Derivatives-Nausea Only    Patient Measurements: Height: 5\' 6"  (167.6 cm) Weight: 201 lb 1 oz (91.2 kg) IBW/kg (Calculated) : 59.3  Heparin Dosing Weight: 78.8 kg  Vital Signs: Temp: 97.7 F (36.5 C) (12/03 0733) Temp src: Oral (12/03 0733) BP: 147/60 mmHg (12/03 0733)  Labs:  Basename 02/22/11 0720 02/21/11 0940 02/21/11 0530 02/21/11 0500 02/21/11 0230 02/20/11 2334 02/20/11 2232 02/20/11 1812 02/20/11 1319 02/20/11 1230  HGB 9.6* -- -- 9.9* -- -- -- -- -- --  HCT 31.1* -- -- 31.9* -- -- -- -- 32.0* --  PLT 112* -- -- 137* -- -- -- -- -- 122*  APTT -- -- -- -- -- -- -- -- -- --  LABPROT -- -- -- -- -- -- -- 14.6 -- --  INR -- -- -- -- -- -- -- 1.12 -- --  HEPARINUNFRC 0.23* 0.39 -- -- 0.19* -- -- -- -- --  CREATININE 1.12* -- -- 1.03 -- -- -- -- 1.20* --  CKTOTAL -- -- 82 -- -- 97 97 -- -- --  CKMB -- -- 2.8 -- -- 3.5 3.7 -- -- --  TROPONINI -- -- <0.30 -- -- <0.30 <0.30 -- -- --   Estimated Creatinine Clearance: 57 ml/min (by C-G formula based on Cr of 1.12).  Medical History: Past Medical History  Diagnosis Date  . Angina at rest   . Coronary artery disease   . Hypertension   . Diabetes mellitus   . Renal disorder   . CHF (congestive heart failure)   . Arthritis   . Cirrhosis   . CAD (coronary artery disease) 02/20/2011  . CHRONIC KIDNEY DISEASE STAGE III (MODERATE) 05/12/2009  . DM 05/12/2009  . Ulcer of lower limb, unspecified 05/12/2009  . Cataract 02/20/2011  . NAFLD (nonalcoholic fatty liver disease) 96/0/4540  . Fibromyalgia   . Glaucoma   . A-fib   . Thrombocytopenia 02/21/2011    Assessment: 65 yo female with chest pain on admit. Heparin level is below-goal with no problem  with line per RN. Plan for cardiac cath later today.   Goal of Therapy:  Heparin level 0.3-0.7 units/ml   Plan:  1. Increase IV heparin to 1400 units/hr.  2. F/u post-cath.   Lorre Munroe, PharmD 02/22/2011,9:50 AM

## 2011-02-22 NOTE — Progress Notes (Signed)
[facility]  DAILY PROGRESS NOTE   Subjective:  No chest pain overnight. No sure if chest wall pain from fibromyalgia or related to CAD.  Her blood glucose is high today, she is on sensitive scale insulin.  Objective:  Temp:  [97.7 F (36.5 C)-98.9 F (37.2 C)] 97.7 F (36.5 C) (12/03 0733) Resp:  [13-20] 15  (12/03 0700) BP: (121-147)/(40-60) 147/60 mmHg (12/03 0733) SpO2:  [95 %-97 %] 97 % (12/03 0400) Weight:  [90.7 kg (199 lb 15.3 oz)-91.2 kg (201 lb 1 oz)] 201 lb 1 oz (91.2 kg) (12/03 0500) Weight change: 0.888 kg (1 lb 15.3 oz)  Intake/Output from previous day: 12/02 0701 - 12/03 0700 In: 2748.3 [P.O.:1140; I.V.:1608.3] Out: 2816 [Urine:2815; Stool:1]  Intake/Output from this shift: Total I/O In: 222.5 [I.V.:222.5] Out: 550 [Urine:550]  Medications: Current Facility-Administered Medications  Medication Dose Route Frequency Provider Last Rate Last Dose  . 0.9 %  sodium chloride infusion   Intravenous Continuous Leone Brand, NP 10 mL/hr at 02/21/11 1807 10 mL at 02/21/11 1807  . 0.9 %  sodium chloride infusion  250 mL Intravenous PRN Leone Brand, NP      . 0.9 %  sodium chloride infusion  1 mL/kg/hr Intravenous Continuous Leone Brand, NP 91.3 mL/hr at 02/22/11 0249 1 mL/kg/hr at 02/22/11 0249  . acetaminophen (TYLENOL) tablet 500 mg  500 mg Oral Q6H PRN Leone Brand, NP      . acetaminophen (TYLENOL) tablet 650 mg  650 mg Oral Q4H PRN Leone Brand, NP      . ALPRAZolam Prudy Feeler) tablet 0.25 mg  0.25 mg Oral BID PRN Leone Brand, NP      . amLODipine (NORVASC) tablet 2.5 mg  2.5 mg Oral Daily Leone Brand, NP   2.5 mg at 02/22/11 0911  . aspirin chewable tablet 324 mg  324 mg Oral Pre-Cath Leone Brand, NP   324 mg at 02/22/11 0517  . aspirin EC tablet 81 mg  81 mg Oral Daily Leone Brand, NP   81 mg at 02/21/11 4098  . bimatoprost (LUMIGAN) 0.03 % ophthalmic solution 1 drop  1 drop Both Eyes QHS Leone Brand, NP   1 drop  at 02/21/11 2200  . brimonidine (ALPHAGAN) 0.2 % ophthalmic solution 1 drop  1 drop Both Eyes TID Leone Brand, NP   1 drop at 02/22/11 0911  . bumetanide (BUMEX) tablet 2 mg  2 mg Oral Daily Leone Brand, NP   2 mg at 02/21/11 0947  . diazepam (VALIUM) tablet 5 mg  5 mg Oral On Call Leone Brand, NP      . docusate sodium (COLACE) capsule 200 mg  200 mg Oral QHS Leone Brand, NP      . exenatide (BYETTA) injection 5 mcg  5 mcg Subcutaneous TID WC Leone Brand, NP   5 mcg at 02/21/11 1715  . fenofibrate tablet 160 mg  160 mg Oral Daily Leone Brand, NP   160 mg at 02/22/11 0911  . fluticasone (FLONASE) 50 MCG/ACT nasal spray 2 spray  2 spray Each Nare Daily Leone Brand, NP   2 spray at 02/22/11 0912  . heparin ADULT infusion 100 units/ml (25000 units/250 ml)  1,250 Units/hr Intravenous Continuous Gary Fleet Abbott, PHARMD 12.5 mL/hr at 02/22/11 0910 1,250 Units/hr at 02/22/11 0910  . insulin aspart (novoLOG) injection 0-15 Units  0-15 Units Subcutaneous TID WC  Leone Brand, NP   8 Units at 02/21/11 1718  . insulin aspart (novoLOG) injection 0-9 Units  0-9 Units Subcutaneous Q4H Leone Brand, NP   3 Units at 02/22/11 0913  . insulin detemir (LEVEMIR) injection 100 Units  100 Units Subcutaneous BID Leone Brand, NP   50 Units at 02/22/11 [phone]  . loratadine (CLARITIN) tablet 10 mg  10 mg Oral Daily Leone Brand, NP   10 mg at 02/22/11 0911  . metaxalone (SKELAXIN) tablet 800 mg  800 mg Oral Daily Leone Brand, NP      . nitroGLYCERIN (NITROSTAT) SL tablet 0.4 mg  0.4 mg Sublingual Q5 min PRN Leone Brand, NP      . nitroGLYCERIN 0.2 mg/mL in dextrose 5 % infusion  5 mcg/min Intravenous Titrated Leone Brand, NP 1.5 mL/hr at 02/22/11 0700 5 mcg/min at 02/22/11 0700  . omega-3 acid ethyl esters (LOVAZA) capsule 1 g  1 g Oral QID Leone Brand, NP   1 g at 02/22/11 1191  . ondansetron (ZOFRAN) injection 4 mg  4 mg Intravenous Q6H PRN Leone Brand, NP      .  pantoprazole (PROTONIX) EC tablet 80 mg  80 mg Oral Q1200 Leone Brand, NP   80 mg at 02/21/11 0148  . rosuvastatin (CRESTOR) tablet 20 mg  20 mg Oral Daily Leone Brand, NP   20 mg at 02/22/11 0911  . silver sulfADIAZINE (SILVADENE) 1 % cream 1 application  1 application Topical Daily Leone Brand, NP   1 application at 02/21/11 1000  . simethicone (MYLICON) chewable tablet 80 mg  80 mg Oral BID Leone Brand, NP   80 mg at 02/22/11 0912  . sodium chloride 0.9 % injection 3 mL  3 mL Intravenous Q12H Leone Brand, NP   10 mL at 02/22/11 0917  . sodium chloride 0.9 % injection 3 mL  3 mL Intravenous PRN Leone Brand, NP      . sotalol (BETAPACE) tablet 80 mg  80 mg Oral BID Leone Brand, NP   80 mg at 02/22/11 4782  . zolpidem (AMBIEN) tablet 5 mg  5 mg Oral QHS PRN Leone Brand, NP        Physical Exam: General appearance: alert and no distress Neck: no adenopathy, no carotid bruit, no JVD, supple, symmetrical, trachea midline and thyroid not enlarged, symmetric, no tenderness/mass/nodules Lungs: clear to auscultation bilaterally Heart: regular rate and rhythm, S1, S2 normal, no murmur, click, rub or gallop Abdomen: soft, non-tender; bowel sounds normal; no masses,  no organomegaly Extremities: extremities normal, atraumatic, no cyanosis or edema Pulses: 2+ and symmetric Neurologic: Grossly normal  Lab Results: Results for orders placed during the hospital encounter of 02/20/11 (from the past 48 hour(s))  CBC     Status: Abnormal   Collection Time   02/20/11 12:30 PM      Component Value Range Comment   WBC 6.0  4.0 - 10.5 (K/uL)    RBC 4.14  3.87 - 5.11 (MIL/uL)    Hemoglobin 10.5 (*) 12.0 - 15.0 (g/dL)    HCT 95.6 (*) 21.3 - 46.0 (%)    MCV 81.4  78.0 - 100.0 (fL)    MCH 25.4 (*) 26.0 - 34.0 (pg)    MCHC 31.2  30.0 - 36.0 (g/dL)    RDW 08.6 (*) 57.8 - 15.5 (%)    Platelets 122 (*) 150 - 400 (K/uL)  DIFFERENTIAL     Status: Normal   Collection Time   02/20/11 12:30  PM      Component Value Range Comment   Neutrophils Relative 64  43 - 77 (%)    Neutro Abs 3.8  1.7 - 7.7 (K/uL)    Lymphocytes Relative 24  12 - 46 (%)    Lymphs Abs 1.4  0.7 - 4.0 (K/uL)    Monocytes Relative 8  3 - 12 (%)    Monocytes Absolute 0.5  0.1 - 1.0 (K/uL)    Eosinophils Relative 4  0 - 5 (%)    Eosinophils Absolute 0.2  0.0 - 0.7 (K/uL)    Basophils Relative 0  0 - 1 (%)    Basophils Absolute 0.0  0.0 - 0.1 (K/uL)   POCT I-STAT TROPONIN I     Status: Normal   Collection Time   02/20/11  1:16 PM      Component Value Range Comment   Troponin i, poc 0.02  0.00 - 0.08 (ng/mL)    Comment 3            POCT I-STAT, CHEM 8     Status: Abnormal   Collection Time   02/20/11  1:19 PM      Component Value Range Comment   Sodium 141  135 - 145 (mEq/L)    Potassium 3.3 (*) 3.5 - 5.1 (mEq/L)    Chloride 102  96 - 112 (mEq/L)    BUN 21  6 - 23 (mg/dL)    Creatinine, Ser 9.81 (*) 0.50 - 1.10 (mg/dL)    Glucose, Bld 191 (*) 70 - 99 (mg/dL)    Calcium, Ion 4.78  1.12 - 1.32 (mmol/L)    TCO2 27  0 - 100 (mmol/L)    Hemoglobin 10.9 (*) 12.0 - 15.0 (g/dL)    HCT 29.5 (*) 62.1 - 46.0 (%)   GLUCOSE, CAPILLARY     Status: Abnormal   Collection Time   02/20/11  5:47 PM      Component Value Range Comment   Glucose-Capillary 108 (*) 70 - 99 (mg/dL)    Comment 1 Documented in Chart      Comment 2 Notify RN     PROTIME-INR     Status: Normal   Collection Time   02/20/11  6:12 PM      Component Value Range Comment   Prothrombin Time 14.6  11.6 - 15.2 (seconds)    INR 1.12  0.00 - 1.49    MRSA PCR SCREENING     Status: Normal   Collection Time   02/20/11 10:00 PM      Component Value Range Comment   MRSA by PCR NEGATIVE  NEGATIVE    GLUCOSE, CAPILLARY     Status: Abnormal   Collection Time   02/20/11 10:08 PM      Component Value Range Comment   Glucose-Capillary 188 (*) 70 - 99 (mg/dL)   CARDIAC PANEL(CRET KIN+CKTOT+MB+TROPI)     Status: Normal   Collection Time   02/20/11 10:32 PM       Component Value Range Comment   Total CK 97  7 - 177 (U/L)    CK, MB 3.7  0.3 - 4.0 (ng/mL)    Troponin I <0.30  <0.30 (ng/mL)    Relative Index RELATIVE INDEX IS INVALID  0.0 - 2.5    CARDIAC PANEL(CRET KIN+CKTOT+MB+TROPI)     Status: Normal   Collection Time   02/20/11 11:34 PM  Component Value Range Comment   Total CK 97  7 - 177 (U/L)    CK, MB 3.5  0.3 - 4.0 (ng/mL)    Troponin I <0.30  <0.30 (ng/mL)    Relative Index RELATIVE INDEX IS INVALID  0.0 - 2.5    HEPARIN LEVEL (UNFRACTIONATED)     Status: Abnormal   Collection Time   02/21/11  2:30 AM      Component Value Range Comment   Heparin Unfractionated 0.19 (*) 0.30 - 0.70 (IU/mL)   TSH     Status: Normal   Collection Time   02/21/11  2:30 AM      Component Value Range Comment   TSH 2.277  0.350 - 4.500 (uIU/mL)   HEMOGLOBIN A1C     Status: Abnormal   Collection Time   02/21/11  2:30 AM      Component Value Range Comment   Hemoglobin A1C 7.6 (*) <5.7 (%)    Mean Plasma Glucose 171 (*) <117 (mg/dL)   CBC     Status: Abnormal   Collection Time   02/21/11  5:00 AM      Component Value Range Comment   WBC 7.6  4.0 - 10.5 (K/uL) WHITE COUNT CONFIRMED ON SMEAR   RBC 3.90  3.87 - 5.11 (MIL/uL)    Hemoglobin 9.9 (*) 12.0 - 15.0 (g/dL)    HCT 16.1 (*) 09.6 - 46.0 (%)    MCV 81.8  78.0 - 100.0 (fL)    MCH 25.4 (*) 26.0 - 34.0 (pg)    MCHC 31.0  30.0 - 36.0 (g/dL)    RDW 04.5 (*) 40.9 - 15.5 (%)    Platelets 137 (*) 150 - 400 (K/uL) PLATELET COUNT CONFIRMED BY SMEAR  BASIC METABOLIC PANEL     Status: Abnormal   Collection Time   02/21/11  5:00 AM      Component Value Range Comment   Sodium 136  135 - 145 (mEq/L)    Potassium 4.6  3.5 - 5.1 (mEq/L)    Chloride 102  96 - 112 (mEq/L)    CO2 26  19 - 32 (mEq/L)    Glucose, Bld 124 (*) 70 - 99 (mg/dL)    BUN 21  6 - 23 (mg/dL)    Creatinine, Ser 8.11  0.50 - 1.10 (mg/dL)    Calcium 8.7  8.4 - 10.5 (mg/dL)    GFR calc non Af Amer 56 (*) >90 (mL/min)    GFR calc Af Amer  65 (*) >90 (mL/min)   HEPATIC FUNCTION PANEL     Status: Abnormal   Collection Time   02/21/11  5:00 AM      Component Value Range Comment   Total Protein 6.7  6.0 - 8.3 (g/dL)    Albumin 2.9 (*) 3.5 - 5.2 (g/dL)    AST 68 (*) 0 - 37 (U/L) HEMOLYSIS AT THIS LEVEL MAY AFFECT RESULT   ALT 20  0 - 35 (U/L)    Alkaline Phosphatase 90  39 - 117 (U/L) HEMOLYSIS AT THIS LEVEL MAY AFFECT RESULT   Total Bilirubin 0.5  0.3 - 1.2 (mg/dL)    Bilirubin, Direct <9.1  0.0 - 0.3 (mg/dL)    Indirect Bilirubin NOT CALCULATED  0.3 - 0.9 (mg/dL)   LIPID PANEL     Status: Abnormal   Collection Time   02/21/11  5:00 AM      Component Value Range Comment   Cholesterol 95  0 - 200 (mg/dL)  Triglycerides 190 (*) <150 (mg/dL)    HDL 28 (*) >56 (mg/dL)    Total CHOL/HDL Ratio 3.4      VLDL 38  0 - 40 (mg/dL)    LDL Cholesterol 29  0 - 99 (mg/dL)   CARDIAC PANEL(CRET KIN+CKTOT+MB+TROPI)     Status: Normal   Collection Time   02/21/11  5:30 AM      Component Value Range Comment   Total CK 82  7 - 177 (U/L)    CK, MB 2.8  0.3 - 4.0 (ng/mL)    Troponin I <0.30  <0.30 (ng/mL)    Relative Index RELATIVE INDEX IS INVALID  0.0 - 2.5    MAGNESIUM     Status: Normal   Collection Time   02/21/11  5:30 AM      Component Value Range Comment   Magnesium 2.1  1.5 - 2.5 (mg/dL)   GLUCOSE, CAPILLARY     Status: Abnormal   Collection Time   02/21/11  7:54 AM      Component Value Range Comment   Glucose-Capillary 130 (*) 70 - 99 (mg/dL)    Comment 1 Notify RN     HEPARIN LEVEL (UNFRACTIONATED)     Status: Normal   Collection Time   02/21/11  9:40 AM      Component Value Range Comment   Heparin Unfractionated 0.39  0.30 - 0.70 (IU/mL)   GLUCOSE, CAPILLARY     Status: Abnormal   Collection Time   02/21/11 12:19 PM      Component Value Range Comment   Glucose-Capillary 293 (*) 70 - 99 (mg/dL)   GLUCOSE, CAPILLARY     Status: Abnormal   Collection Time   02/21/11  4:39 PM      Component Value Range Comment    Glucose-Capillary 260 (*) 70 - 99 (mg/dL)    Comment 1 Notify RN     GLUCOSE, CAPILLARY     Status: Abnormal   Collection Time   02/21/11  9:45 PM      Component Value Range Comment   Glucose-Capillary 234 (*) 70 - 99 (mg/dL)   GLUCOSE, CAPILLARY     Status: Abnormal   Collection Time   02/21/11 11:26 PM      Component Value Range Comment   Glucose-Capillary 249 (*) 70 - 99 (mg/dL)   GLUCOSE, CAPILLARY     Status: Abnormal   Collection Time   02/22/11  4:26 AM      Component Value Range Comment   Glucose-Capillary 268 (*) 70 - 99 (mg/dL)   HEPARIN LEVEL (UNFRACTIONATED)     Status: Abnormal   Collection Time   02/22/11  7:20 AM      Component Value Range Comment   Heparin Unfractionated 0.23 (*) 0.30 - 0.70 (IU/mL)   RETICULOCYTES     Status: Abnormal   Collection Time   02/22/11  7:20 AM      Component Value Range Comment   Retic Ct Pct 3.9 (*) 0.4 - 3.1 (%)    RBC. 3.80 (*) 3.87 - 5.11 (MIL/uL)    Retic Count, Manual 148.2  19.0 - 186.0 (K/uL)   BASIC METABOLIC PANEL     Status: Abnormal   Collection Time   02/22/11  7:20 AM      Component Value Range Comment   Sodium 137  135 - 145 (mEq/L)    Potassium 3.7  3.5 - 5.1 (mEq/L)    Chloride 101  96 - 112 (mEq/L)  CO2 27  19 - 32 (mEq/L)    Glucose, Bld 276 (*) 70 - 99 (mg/dL)    BUN 23  6 - 23 (mg/dL)    Creatinine, Ser 1.61 (*) 0.50 - 1.10 (mg/dL)    Calcium 9.2  8.4 - 10.5 (mg/dL)    GFR calc non Af Amer 50 (*) >90 (mL/min)    GFR calc Af Amer 58 (*) >90 (mL/min)   CBC     Status: Abnormal   Collection Time   02/22/11  7:20 AM      Component Value Range Comment   WBC 5.2  4.0 - 10.5 (K/uL)    RBC 3.80 (*) 3.87 - 5.11 (MIL/uL)    Hemoglobin 9.6 (*) 12.0 - 15.0 (g/dL)    HCT 09.6 (*) 04.5 - 46.0 (%)    MCV 81.8  78.0 - 100.0 (fL)    MCH 25.3 (*) 26.0 - 34.0 (pg)    MCHC 30.9  30.0 - 36.0 (g/dL)    RDW 40.9 (*) 81.1 - 15.5 (%)    Platelets 112 (*) 150 - 400 (K/uL) PLATELET COUNT CONFIRMED BY SMEAR  PLATELET INHIBITION  P2Y12     Status: Normal   Collection Time   02/22/11  7:20 AM      Component Value Range Comment   Platelet Function  P2Y12 345  194 - 418 (PRU)   GLUCOSE, CAPILLARY     Status: Abnormal   Collection Time   02/22/11  7:41 AM      Component Value Range Comment   Glucose-Capillary 244 (*) 70 - 99 (mg/dL)     Imaging: Dg Chest 2 View  02/20/2011  *RADIOLOGY REPORT*  Clinical Data: Chest pain, hypertension, diabetes  CHEST - 2 VIEW  Comparison: 12/05/2010  Findings: Previous coronary bypass changes noted.  Stable borderline cardiomegaly and vascular prominence.  Calcified stable granuloma in the right lower lobe.  No new edema, CHF, consolidation, pneumonia, effusion or pneumothorax.  Trachea midline.  Degenerative changes of the spine.  IMPRESSION: Stable postoperative findings.  Remote granulomatous disease.  No acute process.  Original Report Authenticated By: Judie Petit. Ruel Favors, M.D.    Assessment:  1. Principal Problem: 2.  *Angina at rest 3. Active Problems: 4.  CAD (coronary artery disease) 5.  Cataract 6.  NAFLD (nonalcoholic fatty liver disease) 7.  Thrombocytopenia 8.   Plan:  1. LHC today with grafts .Marland Kitchen Femoral approach. She is agreeable and has provided informed consent.  Time Spent Directly with Patient:  15 minutes  Length of Stay:  LOS: 2 days   Chrystie Nose, MD Attending Cardiologist [facility]  HILTY,Kenneth C 02/22/2011, 9:31 AM

## 2011-02-22 NOTE — Op Note (Signed)
[facility]     CARDIAC CATHETERIZATION REPORT  Brenda [name]   [id number] 1946-02-26  Performing Cardiologist: Chrystie Nose Primary Physician: Michiel Sites, MD Primary Cardiologist:  Dr. Alanda Amass  Procedures Performed:  Left Heart Catheterization via 5 Fr right femoral access  Left Ventriculography, (RAO/LAO) 15 ml/sec for 26 ml total contrast  Native Coronary Angiography  (Internal Mammary Graft Angiography)  (Saphenous Vein Graft / Free Radial Artery Graft Angiography)  Indication(s): Unstable angina  History: 65 y.o. female with a history of coronary artery disease status post CABG x3 vessels, fibromyalgia, hypertension, dyslipidemia, and diabetes. She presents with somewhat typical and atypical chest pain symptoms. There is some reproducibility in her chest pain which is common with her fibromyalgia however some of the pain radiates to her jaw and to her arms. Cardiac enzymes are negative and there are no new EKG changes concerning for ischemia. She is referred for left heart catheterization.  Consent: The procedure with Risks/Benefits/Alternatives and Indications was reviewed with the patient.  All questions were answered.    Risks / Complications include, but not limited to: Death, MI, CVA/TIA, VF/VT (with defibrillation), Bradycardia (need for temporary pacer placement), contrast induced nephropathy, bleeding / bruising / hematoma / pseudoaneurysm, vascular or coronary injury (with possible emergent CT or Vascular Surgery), adverse medication reactions, infection.    The patient voices understanding and agree to proceed.    Risks of procedure as well as the alternatives and risks of each were explained to the (patient/caregiver).  Consent for procedure obtained. Consent for signed by MD and patient with RN witness -- placed on chart.  Procedure: The patient was brought to the 2nd Floor [facility] in the  fasting state and prepped and draped in the usual sterile fashion for (Right groin) access.   Sterile technique was used including antiseptics, cap, gloves, gown, hand hygiene, mask and sheet.  Skin prep: Chlorhexidine;  Time Out: Verified patient identification, verified procedure, site/side was marked, verified correct patient position, special equipment/implants available, medications/allergies/relevent history reviewed, required imaging and test results available.  Performed  The right femoral head was identified using tactile and fluoroscopic technique.  The right groin was anesthetized with 1% subcutaneous Lidocaine.  The right Common Femoral Artery was accessed using the Modified Seldinger Technique with placement of a antimicrobial bonded/coated single lumen (5 Fr) sheath was placed in the right common femoral artery using the Seldinger technique.  The sheath was aspirated and flushed.    A 5 Fr JL4 Catheter was advanced of over a  Standard J wire into the ascending Aorta.  The catheter was used to engage the left coronary artery.  Multiple cineangiographic views of the left coronary artery system(s) were performed. A 5 Fr JR 4 Catheter was advanced of over a Safety J wire into the ascending Aorta.  The catheter was used to engage the right coronary artery.  Multiple cineangiographic views of the right coronary artery system was performed. This catheter was then exchanged over the standard J wire for an angled Pigtail catheter that was advanced across the Aortic Valve.  LV hemodynamics were measured and Left Ventriculography was performed.  LV hemodynamics were then re-sampled, and the catheter was pulled back across the Aortic Valve for measurement of "pull-back" gradient.  The catheter and the wire was removed completely out of the body.  The patient was transferred to the holding area where the sheath was removed with manual pressure held for hemostasis.   The  patient was transported to the  holding in stable condition.   The patient  was stable before, during and following the procedure.   Patient did tolerate procedure well. There were not complications.  EBL: 15 cc  Medications:  Premedication: 5 mg  Valium  Contrast:  65 cc Omnipaque   Hemodynamics:  Central Aortic Pressure / Mean Aortic Pressure: 136/56  LV Pressure / LV End diastolic Pressure: 20  Left Ventriculography:  EF:  65%  Wall Motion: No obvious wall motion abnormalities  Coronary Angiographic Data:  Left Main:  Patent with up to 30% distal stenosis  Left Anterior Descending (LAD):  Occluded proximally  Circumflex (LCx):  Occluded proximally at the origin of the stent  1st obtuse marginal:  Very small vessel that is fed by saphenous vein graft  2nd obtuse marginal:  Smaller vessel then OM1, fed by saphenous vein graft    Right Coronary Artery: Moderate diffuse coronary artery disease  Grafts  LIMA - LAD: Patent large-caliber graft with good distal runoff  SVG - OM1/OM2: Patent vein graft with good runoff, however the distal target vessels are small  SVG - RCA: Patent with good distal runoff  Impression: 1.  native three-vessel coronary disease. Proximally occluded left circumflex and LAD unchanged from previous catheterization. 2.  patent LIMA to LAD, SVG to OM1/OM 2, and SVG to RCA. 3.  blood flow to the lateral OM vessels is likely insufficient. 4.  LVEDP = 20 mmHg. 5.  EF 65%, no wall motion abnormalities  Plan: 1.  medical management of coronary disease. 2.  pain management of underlying fibromyalgia. 3.  probably home tomorrow. Followup with Dr. Alanda Amass in the office Thursday.  The case and results was discussed with the patient and family. The case and results was not discussed with the patient's PCP. The case and results was not discussed with the patient's Cardiologist.  Time Spend Directly with Patient:  45 minutes  Chrystie Nose, MD Attending Cardiologist [facility]  [facility]  Brenda [name],Brenda [name] 02/22/2011, 5:50 PM

## 2011-02-23 LAB — CBC
HCT: 34.6 % — ABNORMAL LOW (ref 36.0–46.0)
MCHC: 30.6 g/dL (ref 30.0–36.0)
MCV: 81.2 fL (ref 78.0–100.0)
RDW: 17.8 % — ABNORMAL HIGH (ref 11.5–15.5)

## 2011-02-23 MED ORDER — ISOSORBIDE MONONITRATE ER 30 MG PO TB24: 30.0000 mg | ORAL_TABLET | Freq: Every day | ORAL | Status: DC

## 2011-02-23 NOTE — Progress Notes (Signed)
MD made aware of pt's cath site at a level 1; pubic area is puffy and tender to the tough; no visible blood ; no orders received; MD ordered just to watch throughout the night;  Will continue to reassess and update as needed

## 2011-02-23 NOTE — Progress Notes (Signed)
Pt received d/c instructions. Pt remains stable and unchanged from this am. Pt's home medicines and f/u appointments reviewed with pt and family.

## 2011-02-23 NOTE — Progress Notes (Signed)
Pt. Seen and examined. Agree with the NP/PA-C note as written.  Small groin hematoma. No pain or bruit. Will re-check HCT. If fairly stable, ok for d/c later today. Follow-up with Dr. Alanda Amass scheduled for Thursday.  Chrystie Nose, MD Attending Cardiologist [facility]

## 2011-02-23 NOTE — Progress Notes (Signed)
Patient ID: Brenda [name], female   DOB: 1945-12-23, 65 y.o.   MRN: [id number]   [facility]  Subjective: No Complaints  Objective: Vital signs in last 24 hours: Temp:  [97.7 F (36.5 C)-98.4 F (36.9 C)] 98.1 F (36.7 C) (12/04 0400) Pulse Rate:  [60] 60  (12/03 1638) Resp:  [14-23] 18  (12/04 0400) BP: (114-147)/(33-60) 114/55 mmHg (12/04 0200) SpO2:  [95 %-97 %] 97 % (12/04 0400) Weight:  [93.1 kg (205 lb 4 oz)] 205 lb 4 oz (93.1 kg) (12/04 0500) Last BM Date: 02/19/11  Intake/Output from previous day: 12/03 0701 - 12/04 0700 In: 1217.5 [I.V.:1217.5] Out: 750 [Urine:750] Intake/Output this shift: Total I/O In: 273 [I.V.:273] Out: -   Medications Current Facility-Administered Medications  Medication Dose Route Frequency Provider Last Rate Last Dose  . 0.9 %  sodium chloride infusion  1 mL/kg/hr Intravenous Continuous Lisette Abu. Hilty 91 mL/hr at 02/22/11 1900 0.998 mL/kg/hr at 02/22/11 1900  . acetaminophen (TYLENOL) tablet 650 mg  650 mg Oral Q4H PRN Leone Brand, NP      . ALPRAZolam Prudy Feeler) tablet 0.25 mg  0.25 mg Oral BID PRN Leone Brand, NP      . amLODipine (NORVASC) tablet 2.5 mg  2.5 mg Oral Daily Leone Brand, NP   2.5 mg at 02/22/11 0911  . aspirin EC tablet 81 mg  81 mg Oral Daily Leone Brand, NP   81 mg at 02/21/11 6295  . bimatoprost (LUMIGAN) 0.03 % ophthalmic solution 1 drop  1 drop Both Eyes QHS Leone Brand, NP   1 drop at 02/22/11 2126  . brimonidine (ALPHAGAN) 0.2 % ophthalmic solution 1 drop  1 drop Both Eyes TID Leone Brand, NP   1 drop at 02/22/11 2138  . bumetanide (BUMEX) tablet 2 mg  2 mg Oral Daily Leone Brand, NP   2 mg at 02/21/11 0947  . diazepam (VALIUM) tablet 5 mg  5 mg Oral On Call Leone Brand, NP   5 mg at 02/22/11 1301  . docusate sodium (COLACE) capsule 200 mg  200 mg Oral QHS Leone Brand, NP      . exenatide (BYETTA) injection 5 mcg  5 mcg Subcutaneous TID WC Leone Brand, NP   5  mcg at 02/23/11 0015  . fenofibrate tablet 160 mg  160 mg Oral Daily Leone Brand, NP   160 mg at 02/22/11 0911  . fluticasone (FLONASE) 50 MCG/ACT nasal spray 2 spray  2 spray Each Nare Daily Leone Brand, NP   2 spray at 02/22/11 0912  . heparin 2-0.9 UNIT/ML-% infusion           . insulin aspart (novoLOG) injection 0-15 Units  0-15 Units Subcutaneous TID WC Leone Brand, NP   8 Units at 02/22/11 2300  . insulin detemir (LEVEMIR) injection 100 Units  100 Units Subcutaneous BID Leone Brand, NP   100 Units at 02/22/11 2136  . lidocaine (XYLOCAINE) 1 % injection           . loratadine (CLARITIN) tablet 10 mg  10 mg Oral Daily Leone Brand, NP   10 mg at 02/22/11 0911  . metaxalone (SKELAXIN) tablet 800 mg  800 mg Oral Daily Leone Brand, NP      . nitroGLYCERIN (NITROSTAT) SL tablet 0.4 mg  0.4 mg Sublingual Q5 min PRN Leone Brand, NP      .  nitroGLYCERIN (NTG ON-CALL) 0.2 mg/mL injection           . omega-3 acid ethyl esters (LOVAZA) capsule 1 g  1 g Oral QID Leone Brand, NP   1 g at 02/22/11 2128  . ondansetron (ZOFRAN) injection 4 mg  4 mg Intravenous Q6H PRN Leone Brand, NP      . pantoprazole (PROTONIX) EC tablet 80 mg  80 mg Oral Q1200 Leone Brand, NP   80 mg at 02/22/11 1207  . rosuvastatin (CRESTOR) tablet 20 mg  20 mg Oral Daily Leone Brand, NP   20 mg at 02/22/11 0911  . silver sulfADIAZINE (SILVADENE) 1 % cream 1 application  1 application Topical Daily Leone Brand, NP   1 application at 02/21/11 1000  . simethicone (MYLICON) chewable tablet 80 mg  80 mg Oral BID Leone Brand, NP   80 mg at 02/22/11 2128  . sodium chloride 0.9 % injection 3 mL  3 mL Intravenous PRN Leone Brand, NP      . sotalol (BETAPACE) tablet 80 mg  80 mg Oral BID Leone Brand, NP   80 mg at 02/22/11 2127  . zolpidem (AMBIEN) tablet 5 mg  5 mg Oral QHS PRN Leone Brand, NP      . DISCONTD: 0.9 %  sodium chloride infusion   Intravenous Continuous Leone Brand, NP 10 mL/hr at  02/21/11 1807 10 mL at 02/21/11 1807  . DISCONTD: 0.9 %  sodium chloride infusion  250 mL Intravenous PRN Leone Brand, NP      . DISCONTD: 0.9 %  sodium chloride infusion  1 mL/kg/hr Intravenous Continuous Leone Brand, NP 91 mL/hr at 02/22/11 1211 0.997 mL/kg/hr at 02/22/11 1211  . DISCONTD: acetaminophen (TYLENOL) tablet 500 mg  500 mg Oral Q6H PRN Leone Brand, NP      . DISCONTD: heparin ADULT infusion 100 units/ml (25000 units/250 ml)  1,400 Units/hr Intravenous Continuous Emeline Gins, PHARMD 14 mL/hr at 02/22/11 0955 1,400 Units/hr at 02/22/11 0955  . DISCONTD: insulin aspart (novoLOG) injection 0-9 Units  0-9 Units Subcutaneous Q4H Leone Brand, NP   3 Units at 02/22/11 1206  . DISCONTD: nitroGLYCERIN 0.2 mg/mL in dextrose 5 % infusion  5 mcg/min Intravenous Titrated Leone Brand, NP   5 mcg/min at 02/22/11 0700  . DISCONTD: sodium chloride 0.9 % injection 3 mL  3 mL Intravenous Q12H Leone Brand, NP   10 mL at 02/22/11 1610    PE: General appearance: alert, cooperative and no distress Lungs: clear to auscultation bilaterally Heart: regular rate and rhythm, S1, S2 normal, no murmur, click, rub or gallop Extremities: No LEE Pulses: 2+ and symmetric Right groin:  Nontender, No erythema or ecchymosis.  A little edematous in the suprapubic region. No briut  Lab Results:   Basename 02/22/11 0720 02/21/11 0500 02/20/11 1319 02/20/11 1230  WBC 5.2 7.6 -- 6.0  HGB 9.6* 9.9* 10.9* --  HCT 31.1* 31.9* 32.0* --  PLT 112* 137* -- 122*   BMET  Basename 02/22/11 0720 02/21/11 0500 02/20/11 1319  NA 137 136 141  K 3.7 4.6 3.3*  CL 101 102 102  CO2 27 26 --  GLUCOSE 276* 124* 205*  BUN 23 21 21   CREATININE 1.12* 1.03 1.20*  CALCIUM 9.2 8.7 --   PT/INR  Basename 02/20/11 1812  LABPROT 14.6  INR 1.12   Cholesterol  Basename 02/21/11 0500  CHOL 95  Cardiac Enzymes No components found with this basename: TROPONIN:3, CKMB:3  Studies/Results: Left  Heart Cath, 02/22/11  Hemodynamics:  Central Aortic Pressure / Mean Aortic Pressure: 136/56  LV Pressure / LV End diastolic Pressure: 20  Left Ventriculography:  EF: 65%  Wall Motion: No obvious wall motion abnormalities  Coronary Angiographic Data:  Left Main: Patent with up to 30% distal stenosis  Left Anterior Descending (LAD): Occluded proximally  Circumflex (LCx): Occluded proximally at the origin of the stent  1st obtuse marginal: Very small vessel that is fed by saphenous vein graft  2nd obtuse marginal: Smaller vessel then OM1, fed by saphenous vein graft  Right Coronary Artery: Moderate diffuse coronary artery disease Grafts  LIMA - LAD: Patent large-caliber graft with good distal runoff  SVG - OM1/OM2: Patent vein graft with good runoff, however the distal target vessels are small  SVG - RCA: Patent with good distal runoff Impression:  1. native three-vessel coronary disease. Proximally occluded left circumflex and LAD unchanged from previous catheterization.  2. patent LIMA to LAD, SVG to OM1/OM 2, and SVG to RCA.  3. blood flow to the lateral OM vessels is likely insufficient.  4. LVEDP = 20 mmHg.  5. EF 65%, no wall motion abnormalities    Assessment/Plan   Principal Problem:  *Angina at rest Active Problems:  CAD (coronary artery disease)  Cataract  NAFLD (nonalcoholic fatty liver disease)  Thrombocytopenia   Plan: Medical Mgt. Cr stable.   Pt looks good.  Groin ok. Nurse concerned that it was "puffy".  ASA, sotalol, amlodipine, crestor.  Not much room with BP for ACE.  Glucose needs better control.  Pt states her sugar is better with her [place].  Home Today.   LOS: 3 days    Juliona Vales W 02/23/2011 5:56 AM

## 2011-02-23 NOTE — Discharge Summary (Signed)
Physician Discharge Summary  Patient ID: Brenda [name] MRN: [id number] DOB/AGE: 1946-02-13 65 y.o.  Admit date: 02/20/2011 Discharge date: 02/23/2011  Discharge Diagnoses:  Principal Problem:  *Angina at rest Active Problems:  CAD (coronary artery disease)  Cataract  NAFLD (nonalcoholic fatty liver disease)  Thrombocytopenia   Discharged Condition: stable  Hospital Course:  The patient is [name] with history of coronary artery disease with coronary artery bypass grafting in 2005 by Dr. Dorris Fetch, hypertension diabetes, mellitus, chronic kidney disease stage III, congestive heart failure, arthritis, cirrhosis,fibromyalgia, cataract, non-alcoholic fatty liver disease.choses a history of tobacco use having quit in 1999.  The patient's last heart catheterization was in July 2011 at that time she had significant native disease with a patent LIMA to the LAD, patent vein graft to the OEM with distal sequential limb occluded on the OM 2 with retrograde collaterals from the right. The vein graft to the RCA was patent and she had a normal ejection fraction. 2-D echocardiogram from May of 2012 showed normal EF and diastolic relaxation abnormality. She presented to [facility] ER with increasing chest pain since November 6. She's been taking nitroglycerin and aspirin more frequently. She noted bilateral arm pain as well as tingling. Patient was admitted and started on IV heparin and IV nitroglycerin.cardiac enzymes were cycled serial EKGs were completed. The patient was set up for cardiac catheterization for Monday, December 3. Cardiac catheterization showed native 3 vessel coronary disease with approximately occluded left circumflex and LAD unchanged from previous catheterization. The LIMA to the LAD, SVG to the OM1/OM 2, SVG to the RCA were all patent(For further details see below).  Patient is noted to have a small groin hematoma post cath. This is nontender with no bruit, no  ecchymosis or erythema.  Chest wall pain may well be related to her fibromyalgia.  Labs: WBC 6.3, hemoglobin 10.6, hematocrit 34.6, platelets 113,vitamin B12- 278,folate 7.4,iron 57 TIBC 313, U. IBC 256, saturation ratios 18,ferritin 154,cardiac enzymes were negative x3. Total cholesterol 95, triglycerides 190, HDL 28, total cholesterol to HDL ratio 3.4, VLDL 38, LDL 29,TSH 2.277, magnesium 2.1   Significant Diagnostic Studies:  Left Heart Cath, 02/22/2011. Hemodynamics:  Central Aortic Pressure / Mean Aortic Pressure: 136/56  LV Pressure / LV End diastolic Pressure: 20  Left Ventriculography:  EF: 65%  Wall Motion: No obvious wall motion abnormalities  Coronary Angiographic Data:  Left Main: Patent with up to 30% distal stenosis  Left Anterior Descending (LAD): Occluded proximally  Circumflex (LCx): Occluded proximally at the origin of the stent  1st obtuse marginal: Very small vessel that is fed by saphenous vein graft  2nd obtuse marginal: Smaller vessel then OM1, fed by saphenous vein graft  Right Coronary Artery: Moderate diffuse coronary artery disease Grafts  LIMA - LAD: Patent large-caliber graft with good distal runoff  SVG - OM1/OM2: Patent vein graft with good runoff, however the distal target vessels are small  SVG - RCA: Patent with good distal runoff Impression:  1. native three-vessel coronary disease. Proximally occluded left circumflex and LAD unchanged from previous catheterization.  2. patent LIMA to LAD, SVG to OM1/OM 2, and SVG to RCA.  3. blood flow to the lateral OM vessels is likely insufficient.  4. LVEDP = 20 mmHg.  5. EF 65%, no wall motion abnormalities  CHEST - 2 VIEW  Comparison: 12/05/2010  Findings: Previous coronary bypass changes noted. Stable  borderline cardiomegaly and vascular prominence. Calcified stable  granuloma in the right lower lobe.  No new edema, CHF,  consolidation, pneumonia, effusion or pneumothorax. Trachea  midline. Degenerative  changes of the spine.  IMPRESSION:  Stable postoperative findings. Remote granulomatous disease.  No acute process.  Discharge Exam: Blood pressure 136/55, pulse 64, temperature 98.5 F (36.9 C), temperature source Oral, resp. rate 17, height 5\' 6"  (1.676 m), weight 93.1 kg (205 lb 4 oz), SpO2 96.00%.  Disposition:  Patient discharged home in stable condition. Recommended she increase her activity slowly. If the catheter site becomes red painful swollen or discharges fluid or pus it is recommended she call our office immediately.  The patient really has a followup appointment with Dr. Alanda Amass on Thursday of this week.  Discharge Orders    Future Orders Please Complete By Expires   Diet - low sodium heart healthy      Increase activity slowly      Discharge instructions      Comments:   1. If the catheter site becomes red painful swollen or discharges fluid or pus, call our office immediately. 2. Our office will call with followup appointment time.     Current Discharge Medication List    START taking these medications   Details  isosorbide mononitrate (IMDUR) 30 MG 24 hr tablet Take 1 tablet (30 mg total) by mouth daily. Qty: 30 tablet, Refills: 3      CONTINUE these medications which have NOT CHANGED   Details  acetaminophen (TYLENOL) 500 MG tablet Take 500 mg by mouth every 6 (six) hours as needed. For pain.     amLODipine (NORVASC) 2.5 MG tablet Take 2.5 mg by mouth daily.      aspirin EC 81 MG tablet Take 81 mg by mouth daily.      bimatoprost (LUMIGAN) 0.01 % SOLN Place 1 drop into both eyes at bedtime.      brimonidine (ALPHAGAN P) 0.1 % SOLN Place 1 drop into both eyes 3 (three) times daily.      bumetanide (BUMEX) 2 MG tablet Take 2 mg by mouth daily.      !! Choline Fenofibrate (TRILIPIX) 135 MG capsule Take 135 mg by mouth daily.      !! Choline Fenofibrate (TRILIPIX) 45 MG capsule Take 45 mg by mouth daily.      docusate sodium (COLACE) 100 MG capsule Take  200 mg by mouth at bedtime.      esomeprazole (NEXIUM) 40 MG capsule Take 40 mg by mouth daily before breakfast.      exenatide (BYETTA 5 MCG PEN) 5 MCG/0.02ML SOLN Inject 5 mcg into the skin 3 (three) times daily with meals.      fluticasone (FLONASE) 50 MCG/ACT nasal spray Place 2 sprays into the nose daily.      insulin detemir (LEVEMIR) 100 UNIT/ML injection Inject 100 Units into the skin 2 (two) times daily.      insulin lispro (HUMALOG) 100 UNIT/ML injection Inject into the skin 3 (three) times daily before meals. SSI- Pt not sure of SS.    loratadine (CLARITIN) 10 MG tablet Take 10 mg by mouth daily.      metaxalone (SKELAXIN) 800 MG tablet Take 800 mg by mouth daily.      mometasone (NASONEX) 50 MCG/ACT nasal spray Place 2 sprays into the nose daily.      nitroGLYCERIN (NITROSTAT) 0.4 MG SL tablet Place 0.4 mg under the tongue every 5 (five) minutes as needed. For pain.     omega-3 acid ethyl esters (LOVAZA) 1 G capsule Take 1 g  by mouth 4 (four) times daily.      rosuvastatin (CRESTOR) 20 MG tablet Take 20 mg by mouth daily.      silver sulfADIAZINE (SILVADENE) 1 % cream Apply 1 application topically daily.      simethicone (MYLICON) 125 MG chewable tablet Chew 125 mg by mouth 3 (three) times daily.      sotalol (BETAPACE) 80 MG tablet Take 80 mg by mouth 2 (two) times daily.       !! - Potential duplicate medications found. Please discuss with provider.    STOP taking these medications     isosorbide mononitrate (ISMO,MONOKET) 20 MG tablet        Follow-up Information    Follow up with KOHUT,WALTER DENNIS .         SignedDwana Melena 02/23/2011, 4:00 PM

## 2011-02-24 NOTE — Discharge Summary (Signed)
Brenda [name] C. Brenda Starry, MD Attending Cardiologist [facility]  

## 2013-04-11 ENCOUNTER — Other Ambulatory Visit: Payer: Self-pay | Admitting: Gastroenterology

## 2014-02-27 ENCOUNTER — Encounter (HOSPITAL_COMMUNITY): Payer: Self-pay | Admitting: Internal Medicine

## 2014-03-27 DIAGNOSIS — R928 Other abnormal and inconclusive findings on diagnostic imaging of breast: Secondary | ICD-10-CM | POA: Diagnosis not present

## 2014-03-27 DIAGNOSIS — R922 Inconclusive mammogram: Secondary | ICD-10-CM | POA: Diagnosis not present

## 2014-04-04 DIAGNOSIS — M79641 Pain in right hand: Secondary | ICD-10-CM | POA: Diagnosis not present

## 2014-04-04 DIAGNOSIS — E0842 Diabetes mellitus due to underlying condition with diabetic polyneuropathy: Secondary | ICD-10-CM | POA: Diagnosis not present

## 2014-04-04 DIAGNOSIS — R0683 Snoring: Secondary | ICD-10-CM | POA: Diagnosis not present

## 2014-04-04 DIAGNOSIS — G5601 Carpal tunnel syndrome, right upper limb: Secondary | ICD-10-CM | POA: Diagnosis not present

## 2014-04-05 DIAGNOSIS — E789 Disorder of lipoprotein metabolism, unspecified: Secondary | ICD-10-CM | POA: Diagnosis not present

## 2014-04-05 DIAGNOSIS — G629 Polyneuropathy, unspecified: Secondary | ICD-10-CM | POA: Diagnosis not present

## 2014-04-05 DIAGNOSIS — E118 Type 2 diabetes mellitus with unspecified complications: Secondary | ICD-10-CM | POA: Diagnosis not present

## 2014-04-23 DIAGNOSIS — B351 Tinea unguium: Secondary | ICD-10-CM | POA: Diagnosis not present

## 2014-04-23 DIAGNOSIS — G629 Polyneuropathy, unspecified: Secondary | ICD-10-CM | POA: Diagnosis not present

## 2014-05-03 DIAGNOSIS — N189 Chronic kidney disease, unspecified: Secondary | ICD-10-CM | POA: Diagnosis not present

## 2014-05-03 DIAGNOSIS — E118 Type 2 diabetes mellitus with unspecified complications: Secondary | ICD-10-CM | POA: Diagnosis not present

## 2014-05-03 DIAGNOSIS — I10 Essential (primary) hypertension: Secondary | ICD-10-CM | POA: Diagnosis not present

## 2014-05-20 DIAGNOSIS — E1122 Type 2 diabetes mellitus with diabetic chronic kidney disease: Secondary | ICD-10-CM | POA: Diagnosis not present

## 2014-05-20 DIAGNOSIS — I2510 Atherosclerotic heart disease of native coronary artery without angina pectoris: Secondary | ICD-10-CM | POA: Diagnosis not present

## 2014-05-20 DIAGNOSIS — N183 Chronic kidney disease, stage 3 (moderate): Secondary | ICD-10-CM | POA: Diagnosis not present

## 2014-05-20 DIAGNOSIS — I10 Essential (primary) hypertension: Secondary | ICD-10-CM | POA: Diagnosis not present

## 2014-05-21 ENCOUNTER — Other Ambulatory Visit: Payer: Self-pay | Admitting: Cardiology

## 2014-05-21 DIAGNOSIS — I872 Venous insufficiency (chronic) (peripheral): Secondary | ICD-10-CM

## 2014-05-29 ENCOUNTER — Ambulatory Visit
Admission: RE | Admit: 2014-05-29 | Discharge: 2014-05-29 | Disposition: A | Discharge status: Home / Self Care | Payer: Medicare Other | Source: Ambulatory Visit | Attending: Cardiology | Admitting: Cardiology

## 2014-05-29 DIAGNOSIS — R609 Edema, unspecified: Secondary | ICD-10-CM | POA: Diagnosis not present

## 2014-05-29 DIAGNOSIS — I872 Venous insufficiency (chronic) (peripheral): Secondary | ICD-10-CM

## 2014-05-29 HISTORY — DX: Gout, unspecified: M10.9

## 2014-05-29 NOTE — Consult Note (Signed)
Chief Complaint: Chief Complaint  Patient presents with  . Advice Only    Consult for Venous Insufficiency of Lower Extremites    Referring Physician(s): Ganji,Jay  History of Present Illness: Brenda [name] is a 69 y.o. female who presents with a long history of progressive bilateral lower extremity edema and pain. No history of varicose or spider vein treatment. No history of DVT, venous ulceration, or pulmonary embolus. There is a history of varicose/spider veins in her mother. She has used knee-high compression hose for several years with partial relief of symptoms. 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 She uses gabapentin and Tylenol for pain relief. She has bilateral lower extremity diabetic neuropathy and has multiple has had multiple bilateral toe surgeries.  Past Medical History  Diagnosis Date  . Angina at rest   . Coronary artery disease   . Hypertension   . Diabetes mellitus   . Renal disorder   . CHF (congestive heart failure)   . Arthritis   . Cirrhosis   . CAD (coronary artery disease) 02/20/2011  . CHRONIC KIDNEY DISEASE STAGE III (MODERATE) 05/12/2009  . DM 05/12/2009  . Ulcer of lower limb, unspecified 05/12/2009  . Cataract 02/20/2011  . NAFLD (nonalcoholic fatty liver disease) 16/03/958  . Fibromyalgia   . Glaucoma   . A-fib   . Thrombocytopenia 02/21/2011  . Gout     Past Surgical History  Procedure Laterality Date  . Coronary angioplasty with stent placement    . Coronary artery bypass graft    . Cesarean section    . Appendectomy    . Bunionectomy    . Amputation    . Cholecystectomy    . Dilation and curettage of uterus    . Abdominal hysterectomy    . Neck lesion biopsy    . Ganglion cyst excision    . Left heart catheterization with coronary/graft angiogram N/A 02/22/2011    Procedure: LEFT HEART CATHETERIZATION WITH Isabel Caprice;  Surgeon: Chrystie Nose, MD;  Location: [facility] CATH LAB;   Service: Cardiovascular;  Laterality: N/A;    Allergies: Aspirin; Ciprofloxacin; Codeine; Fluvastatin sodium; Penicillins; Simvastatin; and Sulfonamide derivatives  Medications: Prior to Admission medications   Medication Sig Start Date End Date Taking? Authorizing Provider  acetaminophen (TYLENOL) 500 MG tablet Take 500 mg by mouth every 6 (six) hours as needed. For pain.    Yes Historical Provider, MD  amLODipine (NORVASC) 5 MG tablet Take 5 mg by mouth daily.   Yes Historical Provider, MD  aspirin EC 81 MG tablet Take 81 mg by mouth daily.     Yes Historical Provider, MD  betamethasone valerate (VALISONE) 0.1 % cream Apply 1 application topically 2 (two) times daily.   Yes Historical Provider, MD  bimatoprost (LUMIGAN) 0.01 % SOLN Place 1 drop into both eyes at bedtime.     Yes Historical Provider, MD  brimonidine (ALPHAGAN P) 0.1 % SOLN Place 1 drop into both eyes 3 (three) times daily.     Yes Historical Provider, MD  bumetanide (BUMEX) 2 MG tablet Take 2 mg by mouth daily.     Yes Historical Provider, MD  cloNIDine (CATAPRES - DOSED IN MG/24 HR) 0.2 mg/24hr patch Place 0.2 mg onto the skin once a week.   Yes Historical Provider, MD  clopidogrel (PLAVIX) 75 MG tablet Take 75 mg by mouth daily.   Yes Historical Provider, MD  docusate sodium (COLACE) 100 MG capsule Take  200 mg by mouth at bedtime.     Yes Historical Provider, MD  esomeprazole (NEXIUM) 40 MG capsule Take 40 mg by mouth daily before breakfast.     Yes Historical Provider, MD  exenatide (BYETTA 5 MCG PEN) 5 MCG/0.02ML SOLN Inject 5 mcg into the skin 3 (three) times daily with meals.     Yes Historical Provider, MD  febuxostat (ULORIC) 40 MG tablet Take 40 mg by mouth daily.   Yes Historical Provider, MD  gabapentin (NEURONTIN) 300 MG capsule Take 300 mg by mouth at bedtime.   Yes Historical Provider, MD  HYDROcodone-acetaminophen (NORCO/VICODIN) 5-325 MG per tablet Take 1 tablet by mouth every 6 (six) hours as needed for  moderate pain.   Yes Historical Provider, MD  insulin detemir (LEVEMIR) 100 UNIT/ML injection Inject 100 Units into the skin 2 (two) times daily.     Yes Historical Provider, MD  insulin lispro (HUMALOG) 100 UNIT/ML injection Inject into the skin 3 (three) times daily before meals. SSI- Pt not sure of SS.   Yes Historical Provider, MD  isosorbide dinitrate (ISORDIL) 20 MG tablet Take 20 mg by mouth 2 (two) times daily.   Yes Historical Provider, MD  loratadine (CLARITIN) 10 MG tablet Take 10 mg by mouth daily.     Yes Historical Provider, MD  meclizine (ANTIVERT) 25 MG tablet Take 25 mg by mouth 4 (four) times daily.   Yes Historical Provider, MD  metoCLOPramide (REGLAN) 10 MG tablet Take 10 mg by mouth 2 (two) times daily.   Yes Historical Provider, MD  mometasone (NASONEX) 50 MCG/ACT nasal spray Place 2 sprays into the nose daily.     Yes Historical Provider, MD  nitroGLYCERIN (NITROSTAT) 0.4 MG SL tablet Place 0.4 mg under the tongue every 5 (five) minutes as needed. For pain.    Yes Historical Provider, MD  psyllium (REGULOID) 0.52 G capsule Take 0.52 g by mouth daily.   Yes Historical Provider, MD  rosuvastatin (CRESTOR) 20 MG tablet Take 20 mg by mouth daily.     Yes Historical Provider, MD  rosuvastatin (CRESTOR) 40 MG tablet Take 40 mg by mouth daily.   Yes Historical Provider, MD  silver sulfADIAZINE (SILVADENE) 1 % cream Apply 1 application topically daily.     Yes Historical Provider, MD  simethicone (MYLICON) 125 MG chewable tablet Chew 125 mg by mouth 3 (three) times daily.     Yes Historical Provider, MD  sotalol (BETAPACE) 80 MG tablet Take 80 mg by mouth 2 (two) times daily.     Yes Historical Provider, MD  spironolactone (ALDACTONE) 25 MG tablet Take 25 mg by mouth 2 (two) times daily.   Yes Historical Provider, MD  amLODipine (NORVASC) 2.5 MG tablet Take 2.5 mg by mouth daily.      Historical Provider, MD  Choline Fenofibrate (TRILIPIX) 135 MG capsule Take 135 mg by mouth daily.       Historical Provider, MD  Choline Fenofibrate (TRILIPIX) 45 MG capsule Take 45 mg by mouth daily.      Historical Provider, MD  fluticasone (FLONASE) 50 MCG/ACT nasal spray Place 2 sprays into the nose daily.      Historical Provider, MD  isosorbide mononitrate (IMDUR) 30 MG 24 hr tablet Take 1 tablet (30 mg total) by mouth daily. 02/23/11 02/23/12  Dwana Melena, PA-C  metaxalone (SKELAXIN) 800 MG tablet Take 800 mg by mouth daily.      Historical Provider, MD  omega-3 acid ethyl esters (LOVAZA) 1 G  capsule Take 1 g by mouth 4 (four) times daily.      Historical Provider, MD     Family History  Problem Relation Age of Onset  . Stroke Mother   . Dementia Mother   . Hepatitis Mother   . Arrhythmia Mother   . Alzheimer's disease Father   . Pneumonia Father   . Cancer Brother   . Dementia Brother     History   Social History  . Marital Status: Married    Spouse Name: N/A  . Number of Children: N/A  . Years of Education: N/A   Social History Main Topics  . Smoking status: Never Smoker   . Smokeless tobacco: Never Used  . Alcohol Use: No  . Drug Use: No  . Sexual Activity: Yes   Other Topics Concern  . Not on file   Social History Narrative    ECOG Status: 1 - Symptomatic but completely ambulatory  Review of Systems: A 12 point ROS discussed and pertinent positives are indicated in the HPI above.  All other systems are negative.  Review of Systems  Constitutional: Positive for fever and fatigue.  HENT: Positive for ear pain, sinus pressure and tinnitus.   Eyes: Positive for visual disturbance.  Respiratory: Positive for cough and shortness of breath.   Cardiovascular: Positive for chest pain.  Gastrointestinal: Positive for nausea, abdominal pain and diarrhea.  Genitourinary: Positive for hematuria.  Musculoskeletal: Positive for myalgias, back pain, arthralgias, gait problem and neck pain.  Skin: Positive for color change and rash.  Neurological: Positive for  light-headedness, numbness and headaches.    Vital Signs: BP 149/64 mmHg  Pulse 72  Temp(Src) 98.5 F (36.9 C) (Oral)  Resp 15  Ht  (1.676 m)  Wt 220 lb (99.791 kg)  BMI 35.53 kg/m2  SpO2 95%  Physical Exam  Constitutional: She appears well-developed and well-nourished. No distress.  HENT:  Head: Normocephalic.  Eyes: EOM are normal. Right eye exhibits no discharge. Left eye exhibits no discharge. No scleral icterus.  Cardiovascular:  Pulses:      Dorsalis pedis pulses are 1+ on the right side, and 1+ on the left side.  Pulmonary/Chest: Effort normal. No stridor. No respiratory distress.  Abdominal: Soft. She exhibits no distension.  Musculoskeletal: She exhibits no tenderness.       Right lower leg: She exhibits edema.       Left lower leg: She exhibits edema.  Neurological: She is alert. Coordination normal.  Skin: Skin is warm and dry. She is not diaphoretic.    Mallampati Score:     Imaging: US Venous Img Lower Bilateral  05/29/2014   CLINICAL DATA:  Bilateral lower extremity edema  EXAM: BILATERAL LOWER EXTREMITY VENOUS DUPLEX ULTRASOUND  TECHNIQUE: Gray-scale sonography with graded compression, as well as color Doppler and duplex ultrasound, were performed to evaluate the deep and superficial veins of both lower extremities. Spectral Doppler was utilized to evaluate flow at rest and with distal augmentation maneuvers. A complete superficial venous insufficiency exam was performed in the upright standing position. I personally performed the technical portion of the exam.  COMPARISON:  None.  FINDINGS: Deep Venous Systems:  Evaluation of the deep venous system including the [name] femoral, femoral, profunda femoral, popliteal and calf veins (where visible) demonstrate no evidence of deep venous thrombosis. The vessels are compressible and demonstrate normal respiratory phasicity and response to augmentation. No evidence of the deep venous reflux.  Superficial Venous  Systems  RIGHT:  SFJ:  patent. transmitted right atrial pulsations. No reflux was elicited after provocative maneuvers.  GSV Prox Thigh: Not visualized  GSV Mid Thigh: Not visualized  GSV Lower Thigh: Not visualized  GSV at Knee: 2.8 mm.  No reflux.  GSV Prox Calf: 4 mm.  No reflux.  GSV Mid Calf: No reflux  GSV Distal Calf: No reflux  SPJ: Not visualized  SSV Prox: Nondilated.  No reflux.  SSV Mid: Nondilated, no reflux.  SSV Distal: Nondilated, no reflux.  LEFT KNEE:  SFJ: patent. transmitted right atrial pulsations. No reflux was elicited after provocative maneuvers.  GSV Prox Thigh: nondilated, no reflux, pulsatile flow.  GSV Mid Thigh: nondilated, no reflux, pulsatile flow  GSV Lower Thigh: nondilated, no reflux, pulsatile flow  GSV at Knee: Nondilated, no reflux.  GSV Prox Calf: Nondilated, no reflux  GSV Mid Calf: Nondilated, no reflux  GSV Distal Calf: Nondilated, no reflux  SPJ: Not visualized  SSV Prox: Nondilated.  No reflux.  SSV Mid: Nondilated, no reflux.  SSV Distal: Nondilated, no reflux.  IMPRESSION: 1. Normal bilateral lower extremity deep venous systems.  No DVT. 2. Nonvisualized right greater saphenous vein in the thigh suggesting previous vein harvest for CABG. 3. The visualized saphenous venous segments of both lower extremities showed no significant valvular incompetence or reflux.   Electronically Signed   By: Corlis Leak  Meleana Commerford M.D.   On: 05/29/2014 16:36    Labs:  CBC: No results for input(s): WBC, HGB, HCT, PLT in the last 8760 hours.  COAGS: No results for input(s): INR, APTT in the last 8760 hours.  BMP: No results for input(s): NA, K, CL, CO2, GLUCOSE, BUN, CALCIUM, CREATININE, GFRNONAA, GFRAA in the last 8760 hours.  Invalid input(s): CMP  LIVER FUNCTION TESTS: No results for input(s): BILITOT, AST, ALT, ALKPHOS, PROT, ALBUMIN in the last 8760 hours.  TUMOR MARKERS: No results for input(s): AFPTM, CEA, CA199, CHROMGRNA in the last 8760 hours.  Assessment and Plan:  My  impression is that, given the basically normal lower extremity Doppler examination of the deep and superficial venous systems, valvular incompetence or reflux likely does not contribute significantly to her lower extremity edema. Clearly her additional medical problems including cardiac issues and renal issues may contribute to lower extremity edema. I did encouraged to continue use of compression hose as needed. She will follow-up if you primarily.  Thank you for this interesting consult.  I greatly enjoyed meeting Brenda CommonRebecca K Diedrich and look forward to participating in their care.  Signed: Sylina Henion III, DAYNE Amaliya Whitelaw 05/29/2014, 4:44 PM   I spent a total of   30 Minutes    in face to face in clinical consultation, greater than 50% of which was counseling/coordinating care for lower extremity edema/swelling.

## 2014-05-31 DIAGNOSIS — I509 Heart failure, unspecified: Secondary | ICD-10-CM | POA: Diagnosis not present

## 2014-05-31 DIAGNOSIS — R609 Edema, unspecified: Secondary | ICD-10-CM | POA: Diagnosis not present

## 2014-05-31 DIAGNOSIS — M25569 Pain in unspecified knee: Secondary | ICD-10-CM | POA: Diagnosis not present

## 2014-05-31 DIAGNOSIS — E118 Type 2 diabetes mellitus with unspecified complications: Secondary | ICD-10-CM | POA: Diagnosis not present

## 2014-07-03 DIAGNOSIS — E119 Type 2 diabetes mellitus without complications: Secondary | ICD-10-CM | POA: Diagnosis not present

## 2014-07-03 DIAGNOSIS — I150 Renovascular hypertension: Secondary | ICD-10-CM | POA: Diagnosis not present

## 2014-07-03 DIAGNOSIS — R809 Proteinuria, unspecified: Secondary | ICD-10-CM | POA: Diagnosis not present

## 2014-07-03 DIAGNOSIS — N182 Chronic kidney disease, stage 2 (mild): Secondary | ICD-10-CM | POA: Diagnosis not present

## 2014-07-23 DIAGNOSIS — B351 Tinea unguium: Secondary | ICD-10-CM | POA: Diagnosis not present

## 2014-07-23 DIAGNOSIS — G629 Polyneuropathy, unspecified: Secondary | ICD-10-CM | POA: Diagnosis not present

## 2014-07-25 DIAGNOSIS — E118 Type 2 diabetes mellitus with unspecified complications: Secondary | ICD-10-CM | POA: Diagnosis not present

## 2014-07-25 DIAGNOSIS — E789 Disorder of lipoprotein metabolism, unspecified: Secondary | ICD-10-CM | POA: Diagnosis not present

## 2014-07-25 DIAGNOSIS — N289 Disorder of kidney and ureter, unspecified: Secondary | ICD-10-CM | POA: Diagnosis not present

## 2014-07-25 DIAGNOSIS — I10 Essential (primary) hypertension: Secondary | ICD-10-CM | POA: Diagnosis not present

## 2014-09-05 DIAGNOSIS — H2512 Age-related nuclear cataract, left eye: Secondary | ICD-10-CM | POA: Diagnosis not present

## 2014-09-05 DIAGNOSIS — H18412 Arcus senilis, left eye: Secondary | ICD-10-CM | POA: Diagnosis not present

## 2014-09-05 DIAGNOSIS — H2511 Age-related nuclear cataract, right eye: Secondary | ICD-10-CM | POA: Diagnosis not present

## 2014-09-05 DIAGNOSIS — H25011 Cortical age-related cataract, right eye: Secondary | ICD-10-CM | POA: Diagnosis not present

## 2014-09-05 DIAGNOSIS — H18411 Arcus senilis, right eye: Secondary | ICD-10-CM | POA: Diagnosis not present

## 2014-09-11 DIAGNOSIS — E118 Type 2 diabetes mellitus with unspecified complications: Secondary | ICD-10-CM | POA: Diagnosis not present

## 2014-09-11 DIAGNOSIS — K5901 Slow transit constipation: Secondary | ICD-10-CM | POA: Diagnosis not present

## 2014-09-11 DIAGNOSIS — I10 Essential (primary) hypertension: Secondary | ICD-10-CM | POA: Diagnosis not present

## 2014-09-11 DIAGNOSIS — E789 Disorder of lipoprotein metabolism, unspecified: Secondary | ICD-10-CM | POA: Diagnosis not present

## 2014-10-03 DIAGNOSIS — E789 Disorder of lipoprotein metabolism, unspecified: Secondary | ICD-10-CM | POA: Diagnosis not present

## 2014-10-03 DIAGNOSIS — E118 Type 2 diabetes mellitus with unspecified complications: Secondary | ICD-10-CM | POA: Diagnosis not present

## 2014-10-03 DIAGNOSIS — I10 Essential (primary) hypertension: Secondary | ICD-10-CM | POA: Diagnosis not present

## 2014-10-24 ENCOUNTER — Encounter: Payer: Self-pay | Admitting: *Deleted

## 2014-10-29 DIAGNOSIS — Z1211 Encounter for screening for malignant neoplasm of colon: Secondary | ICD-10-CM | POA: Diagnosis not present

## 2014-10-30 DIAGNOSIS — E119 Type 2 diabetes mellitus without complications: Secondary | ICD-10-CM | POA: Diagnosis not present

## 2014-10-30 DIAGNOSIS — I10 Essential (primary) hypertension: Secondary | ICD-10-CM | POA: Diagnosis not present

## 2014-10-30 DIAGNOSIS — N182 Chronic kidney disease, stage 2 (mild): Secondary | ICD-10-CM | POA: Diagnosis not present

## 2014-11-06 DIAGNOSIS — I701 Atherosclerosis of renal artery: Secondary | ICD-10-CM | POA: Diagnosis not present

## 2014-11-06 DIAGNOSIS — E1140 Type 2 diabetes mellitus with diabetic neuropathy, unspecified: Secondary | ICD-10-CM | POA: Diagnosis not present

## 2014-11-06 DIAGNOSIS — Z79899 Other long term (current) drug therapy: Secondary | ICD-10-CM | POA: Diagnosis not present

## 2014-11-06 DIAGNOSIS — H25012 Cortical age-related cataract, left eye: Secondary | ICD-10-CM | POA: Diagnosis not present

## 2014-11-06 DIAGNOSIS — I10 Essential (primary) hypertension: Secondary | ICD-10-CM | POA: Diagnosis not present

## 2014-11-06 DIAGNOSIS — Z794 Long term (current) use of insulin: Secondary | ICD-10-CM | POA: Diagnosis not present

## 2014-11-06 DIAGNOSIS — I2510 Atherosclerotic heart disease of native coronary artery without angina pectoris: Secondary | ICD-10-CM | POA: Diagnosis not present

## 2014-11-06 DIAGNOSIS — K760 Fatty (change of) liver, not elsewhere classified: Secondary | ICD-10-CM | POA: Diagnosis not present

## 2014-11-06 DIAGNOSIS — E669 Obesity, unspecified: Secondary | ICD-10-CM | POA: Diagnosis not present

## 2014-11-06 DIAGNOSIS — H2512 Age-related nuclear cataract, left eye: Secondary | ICD-10-CM | POA: Diagnosis not present

## 2014-11-06 DIAGNOSIS — H539 Unspecified visual disturbance: Secondary | ICD-10-CM | POA: Diagnosis not present

## 2014-11-06 DIAGNOSIS — Z880 Allergy status to penicillin: Secondary | ICD-10-CM | POA: Diagnosis not present

## 2014-11-06 DIAGNOSIS — Z7902 Long term (current) use of antithrombotics/antiplatelets: Secondary | ICD-10-CM | POA: Diagnosis not present

## 2014-11-06 DIAGNOSIS — Z882 Allergy status to sulfonamides status: Secondary | ICD-10-CM | POA: Diagnosis not present

## 2014-11-06 DIAGNOSIS — Z885 Allergy status to narcotic agent status: Secondary | ICD-10-CM | POA: Diagnosis not present

## 2014-11-06 DIAGNOSIS — E785 Hyperlipidemia, unspecified: Secondary | ICD-10-CM | POA: Diagnosis not present

## 2014-11-06 HISTORY — PX: CATARACT EXTRACTION W/ INTRAOCULAR LENS IMPLANT: SHX1309

## 2014-11-07 DIAGNOSIS — H2511 Age-related nuclear cataract, right eye: Secondary | ICD-10-CM | POA: Diagnosis not present

## 2014-12-09 DIAGNOSIS — N183 Chronic kidney disease, stage 3 (moderate): Secondary | ICD-10-CM | POA: Diagnosis not present

## 2014-12-09 DIAGNOSIS — I2510 Atherosclerotic heart disease of native coronary artery without angina pectoris: Secondary | ICD-10-CM | POA: Diagnosis not present

## 2014-12-09 DIAGNOSIS — I10 Essential (primary) hypertension: Secondary | ICD-10-CM | POA: Diagnosis not present

## 2014-12-09 DIAGNOSIS — E1122 Type 2 diabetes mellitus with diabetic chronic kidney disease: Secondary | ICD-10-CM | POA: Diagnosis not present

## 2014-12-10 ENCOUNTER — Encounter: Payer: Self-pay | Admitting: Cardiology

## 2014-12-10 ENCOUNTER — Encounter: Payer: Self-pay | Admitting: Cardiovascular Disease

## 2014-12-17 DIAGNOSIS — E118 Type 2 diabetes mellitus with unspecified complications: Secondary | ICD-10-CM | POA: Diagnosis not present

## 2014-12-17 DIAGNOSIS — E789 Disorder of lipoprotein metabolism, unspecified: Secondary | ICD-10-CM | POA: Diagnosis not present

## 2014-12-17 DIAGNOSIS — I10 Essential (primary) hypertension: Secondary | ICD-10-CM | POA: Diagnosis not present

## 2014-12-17 DIAGNOSIS — E119 Type 2 diabetes mellitus without complications: Secondary | ICD-10-CM | POA: Diagnosis not present

## 2014-12-17 DIAGNOSIS — R609 Edema, unspecified: Secondary | ICD-10-CM | POA: Diagnosis not present

## 2015-01-20 DIAGNOSIS — R0602 Shortness of breath: Secondary | ICD-10-CM | POA: Diagnosis not present

## 2015-01-20 DIAGNOSIS — I509 Heart failure, unspecified: Secondary | ICD-10-CM | POA: Diagnosis not present

## 2015-01-20 DIAGNOSIS — E118 Type 2 diabetes mellitus with unspecified complications: Secondary | ICD-10-CM | POA: Diagnosis not present

## 2015-01-22 DIAGNOSIS — E119 Type 2 diabetes mellitus without complications: Secondary | ICD-10-CM | POA: Diagnosis not present

## 2015-01-22 DIAGNOSIS — K5900 Constipation, unspecified: Secondary | ICD-10-CM | POA: Diagnosis not present

## 2015-01-22 DIAGNOSIS — E8881 Metabolic syndrome: Secondary | ICD-10-CM | POA: Diagnosis not present

## 2015-01-22 DIAGNOSIS — Z794 Long term (current) use of insulin: Secondary | ICD-10-CM | POA: Diagnosis not present

## 2015-01-27 DIAGNOSIS — Z961 Presence of intraocular lens: Secondary | ICD-10-CM | POA: Diagnosis not present

## 2015-01-27 DIAGNOSIS — H401431 Capsular glaucoma with pseudoexfoliation of lens, bilateral, mild stage: Secondary | ICD-10-CM | POA: Diagnosis not present

## 2015-01-27 DIAGNOSIS — H4921 Sixth [abducent] nerve palsy, right eye: Secondary | ICD-10-CM | POA: Diagnosis not present

## 2015-01-27 DIAGNOSIS — H2511 Age-related nuclear cataract, right eye: Secondary | ICD-10-CM | POA: Diagnosis not present

## 2015-01-28 ENCOUNTER — Emergency Department (HOSPITAL_COMMUNITY)
Admission: EM | Admit: 2015-01-28 | Discharge: 2015-01-28 | Disposition: A | Discharge status: Home / Self Care | Payer: Medicare Other | Attending: Emergency Medicine | Admitting: Emergency Medicine

## 2015-01-28 ENCOUNTER — Encounter (HOSPITAL_COMMUNITY): Payer: Self-pay

## 2015-01-28 ENCOUNTER — Emergency Department (HOSPITAL_COMMUNITY): Payer: Medicare Other

## 2015-01-28 DIAGNOSIS — Z7984 Long term (current) use of oral hypoglycemic drugs: Secondary | ICD-10-CM | POA: Insufficient documentation

## 2015-01-28 DIAGNOSIS — N183 Chronic kidney disease, stage 3 (moderate): Secondary | ICD-10-CM | POA: Diagnosis not present

## 2015-01-28 DIAGNOSIS — I25110 Atherosclerotic heart disease of native coronary artery with unstable angina pectoris: Secondary | ICD-10-CM | POA: Diagnosis not present

## 2015-01-28 DIAGNOSIS — Z7982 Long term (current) use of aspirin: Secondary | ICD-10-CM | POA: Diagnosis not present

## 2015-01-28 DIAGNOSIS — Z794 Long term (current) use of insulin: Secondary | ICD-10-CM | POA: Diagnosis not present

## 2015-01-28 DIAGNOSIS — R51 Headache: Secondary | ICD-10-CM | POA: Insufficient documentation

## 2015-01-28 DIAGNOSIS — I4891 Unspecified atrial fibrillation: Secondary | ICD-10-CM | POA: Insufficient documentation

## 2015-01-28 DIAGNOSIS — I5032 Chronic diastolic (congestive) heart failure: Secondary | ICD-10-CM | POA: Diagnosis not present

## 2015-01-28 DIAGNOSIS — D696 Thrombocytopenia, unspecified: Secondary | ICD-10-CM | POA: Insufficient documentation

## 2015-01-28 DIAGNOSIS — Z951 Presence of aortocoronary bypass graft: Secondary | ICD-10-CM | POA: Insufficient documentation

## 2015-01-28 DIAGNOSIS — Z79899 Other long term (current) drug therapy: Secondary | ICD-10-CM | POA: Diagnosis not present

## 2015-01-28 DIAGNOSIS — K7460 Unspecified cirrhosis of liver: Secondary | ICD-10-CM | POA: Insufficient documentation

## 2015-01-28 DIAGNOSIS — Z6837 Body mass index (BMI) 37.0-37.9, adult: Secondary | ICD-10-CM | POA: Diagnosis not present

## 2015-01-28 DIAGNOSIS — E1122 Type 2 diabetes mellitus with diabetic chronic kidney disease: Secondary | ICD-10-CM | POA: Insufficient documentation

## 2015-01-28 DIAGNOSIS — E785 Hyperlipidemia, unspecified: Secondary | ICD-10-CM | POA: Diagnosis not present

## 2015-01-28 DIAGNOSIS — I130 Hypertensive heart and chronic kidney disease with heart failure and stage 1 through stage 4 chronic kidney disease, or unspecified chronic kidney disease: Secondary | ICD-10-CM | POA: Diagnosis not present

## 2015-01-28 DIAGNOSIS — I2510 Atherosclerotic heart disease of native coronary artery without angina pectoris: Secondary | ICD-10-CM | POA: Insufficient documentation

## 2015-01-28 DIAGNOSIS — Z955 Presence of coronary angioplasty implant and graft: Secondary | ICD-10-CM | POA: Diagnosis not present

## 2015-01-28 DIAGNOSIS — Z87891 Personal history of nicotine dependence: Secondary | ICD-10-CM | POA: Diagnosis not present

## 2015-01-28 DIAGNOSIS — I25119 Atherosclerotic heart disease of native coronary artery with unspecified angina pectoris: Secondary | ICD-10-CM | POA: Diagnosis not present

## 2015-01-28 DIAGNOSIS — M797 Fibromyalgia: Secondary | ICD-10-CM | POA: Diagnosis not present

## 2015-01-28 DIAGNOSIS — K760 Fatty (change of) liver, not elsewhere classified: Secondary | ICD-10-CM | POA: Diagnosis not present

## 2015-01-28 DIAGNOSIS — E669 Obesity, unspecified: Secondary | ICD-10-CM | POA: Insufficient documentation

## 2015-01-28 DIAGNOSIS — E1136 Type 2 diabetes mellitus with diabetic cataract: Secondary | ICD-10-CM | POA: Insufficient documentation

## 2015-01-28 DIAGNOSIS — Z9842 Cataract extraction status, left eye: Secondary | ICD-10-CM | POA: Insufficient documentation

## 2015-01-28 DIAGNOSIS — Z7902 Long term (current) use of antithrombotics/antiplatelets: Secondary | ICD-10-CM | POA: Diagnosis not present

## 2015-01-28 DIAGNOSIS — E119 Type 2 diabetes mellitus without complications: Secondary | ICD-10-CM | POA: Diagnosis not present

## 2015-01-28 DIAGNOSIS — H532 Diplopia: Secondary | ICD-10-CM | POA: Insufficient documentation

## 2015-01-28 DIAGNOSIS — M316 Other giant cell arteritis: Secondary | ICD-10-CM | POA: Diagnosis not present

## 2015-01-28 DIAGNOSIS — N179 Acute kidney failure, unspecified: Secondary | ICD-10-CM | POA: Diagnosis not present

## 2015-01-28 DIAGNOSIS — I6782 Cerebral ischemia: Secondary | ICD-10-CM | POA: Diagnosis not present

## 2015-01-28 LAB — CBC WITH DIFFERENTIAL/PLATELET
BASOS ABS: 0 10*3/uL (ref 0.0–0.1)
BASOS PCT: 1 %
EOS ABS: 0.3 10*3/uL (ref 0.0–0.7)
EOS PCT: 5 %
HCT: 39.2 % (ref 36.0–46.0)
HEMOGLOBIN: 12.1 g/dL (ref 12.0–15.0)
LYMPHS ABS: 1.3 10*3/uL (ref 0.7–4.0)
Lymphocytes Relative: 22 %
MCH: 26 pg (ref 26.0–34.0)
MCHC: 30.9 g/dL (ref 30.0–36.0)
MCV: 84.1 fL (ref 78.0–100.0)
Monocytes Absolute: 0.6 10*3/uL (ref 0.1–1.0)
Monocytes Relative: 10 %
NEUTROS PCT: 62 %
Neutro Abs: 3.6 10*3/uL (ref 1.7–7.7)
PLATELETS: 121 10*3/uL — AB (ref 150–400)
RBC: 4.66 MIL/uL (ref 3.87–5.11)
RDW: 17.4 % — ABNORMAL HIGH (ref 11.5–15.5)
WBC: 5.8 10*3/uL (ref 4.0–10.5)

## 2015-01-28 LAB — BASIC METABOLIC PANEL
ANION GAP: 9 (ref 5–15)
BUN: 34 mg/dL — AB (ref 6–20)
CHLORIDE: 101 mmol/L (ref 101–111)
CO2: 30 mmol/L (ref 22–32)
Calcium: 9.7 mg/dL (ref 8.9–10.3)
Creatinine, Ser: 1.48 mg/dL — ABNORMAL HIGH (ref 0.44–1.00)
GFR, EST AFRICAN AMERICAN: 41 mL/min — AB (ref >60)
GFR, EST NON AFRICAN AMERICAN: 35 mL/min — AB (ref >60)
Glucose, Bld: 119 mg/dL — ABNORMAL HIGH (ref 65–99)
POTASSIUM: 4.1 mmol/L (ref 3.5–5.1)
SODIUM: 140 mmol/L (ref 135–145)

## 2015-01-28 LAB — CBG MONITORING, ED: GLUCOSE-CAPILLARY: 70 mg/dL (ref 65–99)

## 2015-01-28 LAB — SEDIMENTATION RATE: SED RATE: 35 mm/h — AB (ref 0–22)

## 2015-01-28 LAB — LIPID PANEL
CHOL/HDL RATIO: 3.3 ratio
CHOLESTEROL: 104 mg/dL (ref 0–200)
HDL: 32 mg/dL — ABNORMAL LOW (ref >40)
LDL Cholesterol: 42 mg/dL (ref 0–99)
Triglycerides: 148 mg/dL (ref <150)
VLDL: 30 mg/dL (ref 0–40)

## 2015-01-28 LAB — C-REACTIVE PROTEIN: CRP: 1.2 mg/dL — ABNORMAL HIGH (ref <1.0)

## 2015-01-28 NOTE — ED Notes (Signed)
Phlebotomy at bedside at this time.

## 2015-01-28 NOTE — Consult Note (Signed)
Referring Physician: Tomi Bamberger    Chief Complaint: Diplopia  HPI: Brenda [name] is an 69 y.o. female who reports that she has been having trouble with her vision since the summer.  Reports that she had cataract surgery to her left eye that she continues to have trouble with as a consequence.  Also reports that vision has been blurry in her right eye but she has not had surgery because the left has not completely healed.  Last weak she felt as if her vision was more blurry.  This became at its worst on Friday.  She also describes a headache that she has had on the right side of her head.  On Sunday evening some time she noted that she was seeing double.  She describes a horizontal diplopia-worse on looking to the right.  This has continued since Sunday and has not worsened.  She saw her eye doctor on yesterday and had some blood work done.  She was seen again today and was told there was a concern for GCA.  Due to the fact that she does not tolerate steroids she was sent here.    Date last known well: Date: 01/26/2015 Time last known well: Unable to determine tPA Given: No: Outside time window  Past Medical History  Diagnosis Date  . Angina at rest [facility])   . Coronary artery disease   . Hypertension   . Diabetes mellitus   . Renal disorder   . CHF (congestive heart failure) ([place])   . Arthritis   . Cirrhosis ([place])   . CAD (coronary artery disease) 02/20/2011  . CHRONIC KIDNEY DISEASE STAGE III (MODERATE) 05/12/2009  . DM 05/12/2009  . Ulcer of lower limb, unspecified 05/12/2009  . Cataract 02/20/2011  . NAFLD (nonalcoholic fatty liver disease) 02/20/2011  . Fibromyalgia   . Glaucoma   . A-fib ([place])   . Thrombocytopenia ([place]) 02/21/2011  . Gout   . Hx of CABG     Past Surgical History  Procedure Laterality Date  . Coronary angioplasty with stent placement    . Coronary artery bypass graft    . Cesarean section    . Appendectomy    . Bunionectomy    . Amputation    . Cholecystectomy    .  Dilation and curettage of uterus    . Abdominal hysterectomy    . Neck lesion biopsy    . Ganglion cyst excision    . Left heart catheterization with coronary/graft angiogram N/A 02/22/2011    Procedure: LEFT HEART CATHETERIZATION WITH Beatrix Fetters;  Surgeon: Pixie Casino, MD;  Location: [facility] CATH LAB;  Service: Cardiovascular;  Laterality: N/A;    Family History  Problem Relation Age of Onset  . Stroke Mother   . Dementia Mother   . Hepatitis Mother   . Arrhythmia Mother   . Heart disease Mother   . Hypertension Mother   . Diabetes Mother   . Hyperlipidemia Mother   . Alzheimer's disease Father   . Pneumonia Father   . Heart disease Father   . Hyperlipidemia Father   . Cancer Brother   . Dementia Brother   . Heart failure Maternal Grandmother   . Heart failure Maternal Grandfather   . Heart failure Paternal Grandmother   . Heart failure Paternal Grandfather    Social History:  reports that she has never smoked. She has never used smokeless tobacco. She reports that she does not drink alcohol or use illicit drugs.  Allergies:  Allergies  Allergen Reactions  . Aspirin     REACTION: use only enteric coated  . Ciprofloxacin     REACTION: Felt heart palps and "weird"  . Codeine     REACTION: nausea/vomiting  . Fluvastatin Sodium   . Penicillins     REACTION: rash  . Simvastatin Hives  . Sulfonamide Derivatives Nausea Only    Medications: I have reviewed the patient's current medications. Prior to Admission:  Prior to Admission medications   Medication Sig Start Date End Date Taking? Authorizing Provider  acetaminophen (TYLENOL) 500 MG tablet Take 500 mg by mouth every 6 (six) hours as needed. For pain.    Yes Historical Provider, MD  amLODipine (NORVASC) 5 MG tablet Take 5 mg by mouth daily.   Yes Historical Provider, MD  aspirin EC 81 MG tablet Take 81 mg by mouth daily.     Yes Historical Provider, MD  betamethasone valerate (VALISONE) 0.1 % cream  Apply 1 application topically daily as needed. For rash   Yes Historical Provider, MD  bimatoprost (LUMIGAN) 0.01 % SOLN Place 1 drop into both eyes at bedtime.     Yes Historical Provider, MD  brimonidine (ALPHAGAN P) 0.1 % SOLN Place 1 drop into both eyes 3 (three) times daily.     Yes Historical Provider, MD  bumetanide (BUMEX) 2 MG tablet Take 2 mg by mouth daily.     Yes Historical Provider, MD  Choline Fenofibrate (TRILIPIX) 45 MG capsule Take 45 mg by mouth daily.     Yes Historical Provider, MD  cloNIDine (CATAPRES - DOSED IN MG/24 HR) 0.2 mg/24hr patch Place 0.2 mg onto the skin once a week. Every Saturday   Yes Historical Provider, MD  clopidogrel (PLAVIX) 75 MG tablet Take 75 mg by mouth daily.   Yes Historical Provider, MD  docusate sodium (COLACE) 100 MG capsule Take 200 mg by mouth daily as needed for mild constipation.    Yes Historical Provider, MD  esomeprazole (NEXIUM) 40 MG capsule Take 40 mg by mouth daily before breakfast.     Yes Historical Provider, MD  exenatide (BYETTA 5 MCG PEN) 5 MCG/0.02ML SOLN Inject 5 mcg into the skin 3 (three) times daily with meals.     Yes Historical Provider, MD  febuxostat (ULORIC) 40 MG tablet Take 40 mg by mouth daily.   Yes Historical Provider, MD  gabapentin (NEURONTIN) 300 MG capsule Take 300 mg by mouth at bedtime.   Yes Historical Provider, MD  Icosapent Ethyl (VASCEPA) 1 G CAPS Take 1 capsule by mouth 3 (three) times daily.   Yes Historical Provider, MD  insulin detemir (LEVEMIR) 100 UNIT/ML injection Inject 100 Units into the skin 2 (two) times daily.     Yes Historical Provider, MD  insulin lispro (HUMALOG) 100 UNIT/ML injection Inject 100 Units into the skin 3 (three) times daily before meals. Sliding scale. Patient states she did take 100 units today. Patient states she knows that's a lot of insulin however she had to take it per patient   Yes Historical Provider, MD  isosorbide dinitrate (ISORDIL) 20 MG tablet Take 20 mg by mouth 2  (two) times daily.   Yes Historical Provider, MD  loratadine (CLARITIN) 10 MG tablet Take 10 mg by mouth daily.     Yes Historical Provider, MD  meclizine (ANTIVERT) 25 MG tablet Take 25 mg by mouth daily as needed for dizziness.    Yes Historical Provider, MD  mometasone (NASONEX) 50 MCG/ACT nasal spray Place 2 sprays into  the nose daily.     Yes Historical Provider, MD  nitroGLYCERIN (NITROSTAT) 0.4 MG SL tablet Place 0.4 mg under the tongue every 5 (five) minutes as needed. For pain.    Yes Historical Provider, MD  rosuvastatin (CRESTOR) 20 MG tablet Take 20 mg by mouth daily.     Yes Historical Provider, MD  simethicone (MYLICON) 785 MG chewable tablet Chew 125 mg by mouth 3 (three) times daily.     Yes Historical Provider, MD  sotalol (BETAPACE) 80 MG tablet Take 80 mg by mouth 2 (two) times daily.     Yes Historical Provider, MD  spironolactone (ALDACTONE) 25 MG tablet Take 25 mg by mouth 2 (two) times daily.   Yes Historical Provider, MD  isosorbide mononitrate (IMDUR) 30 MG 24 hr tablet Take 1 tablet (30 mg total) by mouth daily. 02/23/11 02/23/12  Brett Canales, PA-C    ROS: History obtained from the patient  General ROS: negative for - chills, fatigue, fever, night sweats, weight gain or weight loss Psychological ROS: negative for - behavioral disorder, hallucinations, memory difficulties, mood swings or suicidal ideation Ophthalmic ROS: as noted in HPI ENT ROS: negative for - epistaxis, nasal discharge, oral lesions, sore throat, tinnitus or vertigo Allergy and Immunology ROS: negative for - hives or itchy/watery eyes Hematological and Lymphatic ROS: negative for - bleeding problems, bruising or swollen lymph nodes Endocrine ROS: negative for - galactorrhea, hair pattern changes, polydipsia/polyuria or temperature intolerance Respiratory ROS: negative for - cough, hemoptysis, shortness of breath or wheezing Cardiovascular ROS: lower extremity edema Gastrointestinal ROS: negative for  - abdominal pain, diarrhea, hematemesis, nausea/vomiting or stool incontinence Genito-Urinary ROS: negative for - dysuria, hematuria, incontinence or urinary frequency/urgency Musculoskeletal ROS: joint pain Neurological ROS: as noted in HPI Dermatological ROS: negative for rash and skin lesion changes  Physical Examination: Blood pressure 151/73, pulse 62, temperature 98.7 F (37.1 C), temperature source Oral, resp. rate 18, SpO2 97 %.  HEENT-  Normocephalic, no lesions, without obvious abnormality.  Normal external eye and conjunctiva.  Normal TM's bilaterally.  Normal auditory canals and external ears. Normal external nose, mucus membranes and septum.  Normal pharynx. Cardiovascular- S1, S2 normal, pulses palpable throughout   Lungs- chest clear, no wheezing, rales, normal symmetric air entry Abdomen- soft, non-tender; bowel sounds normal; no masses,  no organomegaly Extremities- no edema Lymph-no adenopathy palpable Musculoskeletal-no joint tenderness, deformity or swelling Skin-warm and dry, no hyperpigmentation, vitiligo, or suspicious lesions  Neurological Examination Mental Status: Alert, oriented, thought content appropriate.  Speech fluent without evidence of aphasia.  Able to follow 3 step commands without difficulty. Cranial Nerves: II: Discs flat bilaterally; Visual fields grossly normal, pupils equal, round, reactive to light and accommodation III,IV, VI: ptosis not present, incomplete deviation of the right eye laterally V,VII: smile symmetric, facial light touch sensation normal bilaterally VIII: hearing normal bilaterally IX,X: gag reflex present XI: bilateral shoulder shrug XII: midline tongue extension Motor: Right : Upper extremity   5/5    Left:     Upper extremity   5/5  Lower extremity   5/5     Lower extremity   5/5 Tone and bulk:normal tone throughout; no atrophy noted Sensory: Pinprick and light touch intact throughout, bilaterally Deep Tendon Reflexes: 2+  in the upper extremities and absent in the lower extremities Plantars: Right: mute   Left: toe amputation Cerebellar: normal finger-to-nose and normal heel-to-shin testing bilaterally Gait: not tested due to safety concerns   Laboratory Studies:  Basic Metabolic Panel:  Recent Labs Lab 01/28/15 1602  NA 140  K 4.1  CL 101  CO2 30  GLUCOSE 119*  BUN 34*  CREATININE 1.48*  CALCIUM 9.7    Liver Function Tests: No results for input(s): AST, ALT, ALKPHOS, BILITOT, PROT, ALBUMIN in the last 168 hours. No results for input(s): LIPASE, AMYLASE in the last 168 hours. No results for input(s): AMMONIA in the last 168 hours.  CBC:  Recent Labs Lab 01/28/15 1602  WBC 5.8  NEUTROABS 3.6  HGB 12.1  HCT 39.2  MCV 84.1  PLT 121*    Cardiac Enzymes: No results for input(s): CKTOTAL, CKMB, CKMBINDEX, TROPONINI in the last 168 hours.  BNP: Invalid input(s): POCBNP  CBG: No results for input(s): GLUCAP in the last 168 hours.  Microbiology: Results for orders placed or performed during the hospital encounter of 02/20/11  MRSA PCR Screening     Status: None   Collection Time: 02/20/11 10:00 PM  Result Value Ref Range Status   MRSA by PCR NEGATIVE NEGATIVE Final    Comment:        The GeneXpert MRSA Assay (FDA approved for NASAL specimens only), is one component of a comprehensive MRSA colonization surveillance program. It is not intended to diagnose MRSA infection nor to guide or monitor treatment for MRSA infections.    Coagulation Studies: No results for input(s): LABPROT, INR in the last 72 hours.  Urinalysis: No results for input(s): COLORURINE, LABSPEC, PHURINE, GLUCOSEU, HGBUR, BILIRUBINUR, KETONESUR, PROTEINUR, UROBILINOGEN, NITRITE, LEUKOCYTESUR in the last 168 hours.  Invalid input(s): APPERANCEUR  Lipid Panel:    Component Value Date/Time   CHOL 95 02/21/2011 0500   TRIG 190* 02/21/2011 0500   HDL 28* 02/21/2011 0500   CHOLHDL 3.4 02/21/2011 0500    VLDL 38 02/21/2011 0500   LDLCALC 29 02/21/2011 0500    HgbA1C:  Lab Results  Component Value Date   HGBA1C 7.6* 02/21/2011    Urine Drug Screen:  No results found for: LABOPIA, COCAINSCRNUR, LABBENZ, AMPHETMU, THCU, LABBARB  Alcohol Level: No results for input(s): ETH in the last 168 hours.  Imaging: No results found.  Assessment: 69 y.o. female presenting with blurred vision and diplopia.  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  ESR 35.  Patient with multiple vascular risk factors.  Further work up recommended.  Would not treat patient for GCA empirically at this time due to allergy to steroids and renal insufficiency that would make use of other medications complicated.  If no evidence of ischemic disease would consider biopsy and further monitoring of ESR and CRP.    Stroke Risk Factors - atrial fibrillation, diabetes mellitus and hypertension  Plan: 1. HgbA1c, fasting lipid panel, CRP 2. MRI of the brain without contrast 3. PT consult, OT consult, Speech consult 4. Continue ASA and Plavix 7. NPO until RN stroke swallow screen 8. Telemetry monitoring 9. Frequent neuro checks   Case discussed with Dr. Mariella Saa, MD [facility] [phone] 01/28/2015, 5:29 PM

## 2015-01-28 NOTE — ED Provider Notes (Signed)
CSN: [id number]     Arrival date & time 01/28/15  1543 History   First MD Initiated Contact with Patient 01/28/15 1617     Chief Complaint  Patient presents with  . Diplopia  . Headache   HPI Pt started having a headache on Friday.  It was mostly right sided.  She then developed some double vision.  She was evaluated by the eye doctor at [facility] eye surgical.  She was told she might have giant cell arteritis.  She was told to come to the ED today to have further evaluation and testing. She is not having any trouble with numbness or weakness. She has some chronic arthritis issues that affect her gait but has not noticed any specific changes associated with that. Her right eye still feels sore. Past Medical History  Diagnosis Date  . Angina at rest [facility])   . Coronary artery disease   . Hypertension   . Diabetes mellitus   . Renal disorder   . CHF (congestive heart failure) ([facility])   . Arthritis   . Cirrhosis ([facility])   . CAD (coronary artery disease) 02/20/2011  . CHRONIC KIDNEY DISEASE STAGE III (MODERATE) 05/12/2009  . DM 05/12/2009  . Ulcer of lower limb, unspecified 05/12/2009  . Cataract 02/20/2011  . NAFLD (nonalcoholic fatty liver disease) 40/11/8117  . Fibromyalgia   . Glaucoma   . A-fib ([facility])   . Thrombocytopenia ([facility]) 02/21/2011  . Gout   . Hx of CABG    Past Surgical History  Procedure Laterality Date  . Coronary angioplasty with stent placement    . Coronary artery bypass graft    . Cesarean section    . Appendectomy    . Bunionectomy    . Amputation    . Cholecystectomy    . Dilation and curettage of uterus    . Abdominal hysterectomy    . Neck lesion biopsy    . Ganglion cyst excision    . Left heart catheterization with coronary/graft angiogram N/A 02/22/2011    Procedure: LEFT HEART CATHETERIZATION WITH Isabel Caprice;  Surgeon: Chrystie Nose, MD;  Location: [facility] CATH LAB;  Service: Cardiovascular;  Laterality: N/A;   Family History  Problem Relation Age  of Onset  . Stroke Mother   . Dementia Mother   . Hepatitis Mother   . Arrhythmia Mother   . Heart disease Mother   . Hypertension Mother   . Diabetes Mother   . Hyperlipidemia Mother   . Alzheimer's disease Father   . Pneumonia Father   . Heart disease Father   . Hyperlipidemia Father   . Cancer Brother   . Dementia Brother   . Heart failure Maternal Grandmother   . Heart failure Maternal Grandfather   . Heart failure Paternal Grandmother   . Heart failure Paternal Grandfather    Social History  Substance Use Topics  . Smoking status: Never Smoker   . Smokeless tobacco: Never Used  . Alcohol Use: No   OB History    No data available     Review of Systems  All other systems reviewed and are negative.     Allergies  Aspirin; Ciprofloxacin; Codeine; Fluvastatin sodium; Penicillins; Simvastatin; and Sulfonamide derivatives  Home Medications   Prior to Admission medications   Medication Sig Start Date End Date Taking? Authorizing Provider  acetaminophen (TYLENOL) 500 MG tablet Take 500 mg by mouth every 6 (six) hours as needed. For pain.    Yes Historical Provider, MD  amLODipine (  NORVASC) 5 MG tablet Take 5 mg by mouth daily.   Yes Historical Provider, MD  aspirin EC 81 MG tablet Take 81 mg by mouth daily.     Yes Historical Provider, MD  betamethasone valerate (VALISONE) 0.1 % cream Apply 1 application topically daily as needed. For rash   Yes Historical Provider, MD  bimatoprost (LUMIGAN) 0.01 % SOLN Place 1 drop into both eyes at bedtime.     Yes Historical Provider, MD  brimonidine (ALPHAGAN P) 0.1 % SOLN Place 1 drop into both eyes 3 (three) times daily.     Yes Historical Provider, MD  bumetanide (BUMEX) 2 MG tablet Take 2 mg by mouth daily.     Yes Historical Provider, MD  Choline Fenofibrate (TRILIPIX) 45 MG capsule Take 45 mg by mouth daily.     Yes Historical Provider, MD  cloNIDine (CATAPRES - DOSED IN MG/24 HR) 0.2 mg/24hr patch Place 0.2 mg onto the skin  once a week. Every Saturday   Yes Historical Provider, MD  clopidogrel (PLAVIX) 75 MG tablet Take 75 mg by mouth daily.   Yes Historical Provider, MD  docusate sodium (COLACE) 100 MG capsule Take 200 mg by mouth daily as needed for mild constipation.    Yes Historical Provider, MD  esomeprazole (NEXIUM) 40 MG capsule Take 40 mg by mouth daily before breakfast.     Yes Historical Provider, MD  exenatide (BYETTA 5 MCG PEN) 5 MCG/0.02ML SOLN Inject 5 mcg into the skin 3 (three) times daily with meals.     Yes Historical Provider, MD  febuxostat (ULORIC) 40 MG tablet Take 40 mg by mouth daily.   Yes Historical Provider, MD  gabapentin (NEURONTIN) 300 MG capsule Take 300 mg by mouth at bedtime.   Yes Historical Provider, MD  Icosapent Ethyl (VASCEPA) 1 G CAPS Take 1 capsule by mouth 3 (three) times daily.   Yes Historical Provider, MD  insulin detemir (LEVEMIR) 100 UNIT/ML injection Inject 100 Units into the skin 2 (two) times daily.     Yes Historical Provider, MD  insulin lispro (HUMALOG) 100 UNIT/ML injection Inject 100 Units into the skin 3 (three) times daily before meals. Sliding scale. Patient states she did take 100 units today. Patient states she knows that's a lot of insulin however she had to take it per patient   Yes Historical Provider, MD  isosorbide dinitrate (ISORDIL) 20 MG tablet Take 20 mg by mouth 2 (two) times daily.   Yes Historical Provider, MD  loratadine (CLARITIN) 10 MG tablet Take 10 mg by mouth daily.     Yes Historical Provider, MD  meclizine (ANTIVERT) 25 MG tablet Take 25 mg by mouth daily as needed for dizziness.    Yes Historical Provider, MD  mometasone (NASONEX) 50 MCG/ACT nasal spray Place 2 sprays into the nose daily.     Yes Historical Provider, MD  nitroGLYCERIN (NITROSTAT) 0.4 MG SL tablet Place 0.4 mg under the tongue every 5 (five) minutes as needed. For pain.    Yes Historical Provider, MD  rosuvastatin (CRESTOR) 20 MG tablet Take 20 mg by mouth daily.     Yes  Historical Provider, MD  simethicone (MYLICON) 125 MG chewable tablet Chew 125 mg by mouth 3 (three) times daily.     Yes Historical Provider, MD  sotalol (BETAPACE) 80 MG tablet Take 80 mg by mouth 2 (two) times daily.     Yes Historical Provider, MD  spironolactone (ALDACTONE) 25 MG tablet Take 25 mg by mouth 2 (  two) times daily.   Yes Historical Provider, MD  isosorbide mononitrate (IMDUR) 30 MG 24 hr tablet Take 1 tablet (30 mg total) by mouth daily. 02/23/11 02/23/12  Kelle Darting Hager, PA-C   BP 134/42 mmHg  Pulse 40  Temp(Src) 98.7 F (37.1 C) (Oral)  Resp 18  SpO2 97% Physical Exam  Constitutional: She appears well-developed and well-nourished. No distress.  HENT:  Head: Normocephalic and atraumatic.  Right Ear: External ear normal.  Left Ear: External ear normal.  No temporal artery ttp  Eyes: Conjunctivae are normal. Right eye exhibits no discharge. Left eye exhibits no discharge. No scleral icterus.  Neck: Neck supple. No tracheal deviation present.  Cardiovascular: Normal rate, regular rhythm and intact distal pulses.   Pulmonary/Chest: Effort normal and breath sounds normal. No stridor. No respiratory distress. She has no wheezes. She has no rales.  Abdominal: Soft. Bowel sounds are normal. She exhibits no distension. There is no tenderness. There is no rebound and no guarding.  Musculoskeletal: She exhibits no edema or tenderness.  Neurological: She is alert. She has normal strength. No cranial nerve deficit (no facial droop, extraocular movements intact, no slurred speech) or sensory deficit. She exhibits normal muscle tone. She displays no seizure activity. Coordination normal.  Skin: Skin is warm and dry. No rash noted.  Psychiatric: She has a normal mood and affect.  Nursing note and vitals reviewed.   ED Course  Procedures (including critical care time) Labs Review Labs Reviewed  CBC WITH DIFFERENTIAL/PLATELET - Abnormal; Notable for the following:    RDW 17.4 (*)     Platelets 121 (*)    All other components within normal limits  BASIC METABOLIC PANEL - Abnormal; Notable for the following:    Glucose, Bld 119 (*)    BUN 34 (*)    Creatinine, Ser 1.48 (*)    GFR calc non Af Amer 35 (*)    GFR calc Af Amer 41 (*)    All other components within normal limits  SEDIMENTATION RATE - Abnormal; Notable for the following:    Sed Rate 35 (*)    All other components within normal limits  C-REACTIVE PROTEIN - Abnormal; Notable for the following:    CRP 1.2 (*)    All other components within normal limits  LIPID PANEL - Abnormal; Notable for the following:    HDL 32 (*)    All other components within normal limits  HEMOGLOBIN A1C  CBG MONITORING, ED    Imaging Review [name] Contrast  01/28/2015  CLINICAL DATA:  Blurred vision.  Diplopia. EXAM: MRI HEAD WITHOUT CONTRAST TECHNIQUE: Multiplanar, multiecho pulse sequences of the brain and surrounding structures were obtained without intravenous contrast. COMPARISON:  None. FINDINGS: Ventricle size normal. Cerebral volume normal. Pituitary not enlarged. No compression of the optic chiasm. Negative for acute infarct. Small hyperintensities in the right parietal white matter consistent with mild chronic microvascular ischemia. Brainstem and basal ganglia normal. Negative for intracranial hemorrhage.  Negative for mass or edema Paranasal sinuses clear.  Normal orbit. IMPRESSION: No acute intracranial abnormality. Mild chronic microvascular ischemic change in the white matter. Electronically Signed   By: Marlan Palau M.D.   On: 01/28/2015 20:13   I have personally reviewed and evaluated these images and lab results as part of my medical decision-making.    MDM   Final diagnoses:  Diplopia    Case was discussed with Dr Thad Ranger who evaluated the patient in the ED.  Doubts that symptoms are  related to giant cell arteritis.  Will get an MRI of the brain and decide on further workup after that.  Patient's MRI  is negative. No evidence of stroke.  Discussed the case with Dr. Roseanne RenoStewart. Patient can follow-up with her ophthalmologist. No indication for steroids at this time.    Linwood DibblesJon Tauna Macfarlane, MD 01/28/15 2149

## 2015-01-28 NOTE — ED Notes (Addendum)
Pt reports right sided headache with diplopia since Friday. Seen by Dr.Timothy Darel HongBeavis with opthalmology at [facility] [phone]) yesterday and today, told she could possibly have giant cell arteritis. Reports soreness to right eye.

## 2015-01-28 NOTE — ED Notes (Signed)
Patient transported to MRI 

## 2015-01-28 NOTE — ED Notes (Signed)
Discharge instructions reviewed with patient. Understanding verbalized. No acute distress noted at time of discharge. 

## 2015-01-28 NOTE — Discharge Instructions (Signed)
Diplopia °Diplopia is the condition of having double vision or seeing two of a single object. There are many causes of diplopia. Some are not dangerous and can be easily corrected. Diplopia may also be a symptom of a serious medical problem. °There are two types of diplopia. °· Monocular diplopia. This is double vision that affects only one eye. Monocular diplopia is often caused by a clouding of the lens in your eye (cataract) or by disruptions in the way that your eye focuses light. °· Binocular diplopia. This is double vision that affects both eyes. However, when you shut one eye, the double vision will go away. Binocular diplopia may be more serious. It can be caused by: °¨ Problems with the nerves or muscles that are responsible for eye movement. °¨ Neurologic diseases. °¨ Thyroid problems. °¨ Tumors. °¨ An infection near your eyes. °¨ A stroke. °You may need to see a health care provider who specializes in eye conditions (ophthalmologist) or a nerve specialist (neurologist) to find the cause. °HOME CARE INSTRUCTIONS °· Tell your health care provider about any changes in your vision. °· Do not drive or operate heavy machinery if diplopia interferes with your vision. °· Keep all follow-up visits as directed by your health care provider. This is important. °SEEK MEDICAL CARE IF: °· Your diplopia gets worse. °· You develop any other symptoms along with your diplopia, such as: °¨ Weakness. °¨ Numbness. °¨ Headache. °¨ Eye pain. °¨ Clumsiness. °¨ Nausea. °¨ Drooping eyelids. °¨ Abnormal movement of one of your eyes. °SEEK IMMEDIATE MEDICAL CARE IF: °· You have sudden vision loss. °· You suddenly get a very bad headache. °· You have sudden weakness or numbness. °· You suddenly lose the ability to speak, understand speech, or both. °  °This information is not intended to replace advice given to you by your health care provider. Make sure you discuss any questions you have with your health care provider. °  °Document  Released: 01/08/2004 Document Revised: 07/23/2014 Document Reviewed: 01/30/2014 °Elsevier Interactive Patient Education ©2016 [employer] ° °

## 2015-01-29 ENCOUNTER — Observation Stay (EMERGENCY_DEPARTMENT_HOSPITAL)
Admission: AD | Admit: 2015-01-29 | Discharge: 2015-02-01 | Disposition: A | Discharge status: Home / Self Care | Payer: Medicare Other | Source: Ambulatory Visit | Attending: Internal Medicine | Admitting: Internal Medicine

## 2015-01-29 DIAGNOSIS — E669 Obesity, unspecified: Secondary | ICD-10-CM

## 2015-01-29 DIAGNOSIS — N183 Chronic kidney disease, stage 3 unspecified: Secondary | ICD-10-CM | POA: Diagnosis present

## 2015-01-29 DIAGNOSIS — M316 Other giant cell arteritis: Secondary | ICD-10-CM | POA: Diagnosis present

## 2015-01-29 DIAGNOSIS — Z419 Encounter for procedure for purposes other than remedying health state, unspecified: Secondary | ICD-10-CM

## 2015-01-29 DIAGNOSIS — K760 Fatty (change of) liver, not elsewhere classified: Secondary | ICD-10-CM | POA: Diagnosis present

## 2015-01-29 DIAGNOSIS — N179 Acute kidney failure, unspecified: Secondary | ICD-10-CM

## 2015-01-29 DIAGNOSIS — E1169 Type 2 diabetes mellitus with other specified complication: Secondary | ICD-10-CM

## 2015-01-29 DIAGNOSIS — I25119 Atherosclerotic heart disease of native coronary artery with unspecified angina pectoris: Secondary | ICD-10-CM | POA: Diagnosis not present

## 2015-01-29 DIAGNOSIS — I5032 Chronic diastolic (congestive) heart failure: Secondary | ICD-10-CM

## 2015-01-29 DIAGNOSIS — E119 Type 2 diabetes mellitus without complications: Secondary | ICD-10-CM | POA: Diagnosis not present

## 2015-01-29 DIAGNOSIS — I2510 Atherosclerotic heart disease of native coronary artery without angina pectoris: Secondary | ICD-10-CM | POA: Diagnosis present

## 2015-01-29 DIAGNOSIS — H409 Unspecified glaucoma: Secondary | ICD-10-CM | POA: Diagnosis present

## 2015-01-29 DIAGNOSIS — H532 Diplopia: Secondary | ICD-10-CM | POA: Diagnosis present

## 2015-01-29 HISTORY — DX: Personal history of other medical treatment: Z92.89

## 2015-01-29 HISTORY — DX: Gastro-esophageal reflux disease without esophagitis: K21.9

## 2015-01-29 HISTORY — DX: Unspecified glaucoma: H40.9

## 2015-01-29 HISTORY — DX: Low back pain, unspecified: M54.50

## 2015-01-29 HISTORY — DX: Hyperlipidemia, unspecified: E78.5

## 2015-01-29 HISTORY — DX: Other chronic pain: G89.29

## 2015-01-29 HISTORY — DX: Type 2 diabetes mellitus without complications: E11.9

## 2015-01-29 HISTORY — DX: Type 2 diabetes mellitus with diabetic polyneuropathy: E11.42

## 2015-01-29 HISTORY — DX: Low back pain: M54.5

## 2015-01-29 HISTORY — DX: Unspecified jaundice: R17

## 2015-01-29 HISTORY — DX: Headache: R51

## 2015-01-29 HISTORY — DX: Acute embolism and thrombosis of unspecified deep veins of unspecified lower extremity: I82.409

## 2015-01-29 HISTORY — DX: Headache, unspecified: R51.9

## 2015-01-29 HISTORY — DX: Sleep apnea, unspecified: G47.30

## 2015-01-29 LAB — COMPREHENSIVE METABOLIC PANEL
ALBUMIN: 3.4 g/dL — AB (ref 3.5–5.0)
ALT: 21 U/L (ref 14–54)
ANION GAP: 9 (ref 5–15)
AST: 34 U/L (ref 15–41)
Alkaline Phosphatase: 77 U/L (ref 38–126)
BUN: 34 mg/dL — ABNORMAL HIGH (ref 6–20)
CO2: 27 mmol/L (ref 22–32)
Calcium: 9.5 mg/dL (ref 8.9–10.3)
Chloride: 101 mmol/L (ref 101–111)
Creatinine, Ser: 1.39 mg/dL — ABNORMAL HIGH (ref 0.44–1.00)
GFR calc non Af Amer: 38 mL/min — ABNORMAL LOW (ref >60)
GFR, EST AFRICAN AMERICAN: 44 mL/min — AB (ref >60)
GLUCOSE: 83 mg/dL (ref 65–99)
POTASSIUM: 3.6 mmol/L (ref 3.5–5.1)
Sodium: 137 mmol/L (ref 135–145)
Total Bilirubin: 0.5 mg/dL (ref 0.3–1.2)
Total Protein: 7.1 g/dL (ref 6.5–8.1)

## 2015-01-29 LAB — GLUCOSE, CAPILLARY: Glucose-Capillary: 83 mg/dL (ref 65–99)

## 2015-01-29 LAB — SEDIMENTATION RATE: Sed Rate: 38 mm/hr — ABNORMAL HIGH (ref 0–22)

## 2015-01-29 LAB — CBC
HCT: 37.6 % (ref 36.0–46.0)
Hemoglobin: 11.7 g/dL — ABNORMAL LOW (ref 12.0–15.0)
MCH: 26.1 pg (ref 26.0–34.0)
MCHC: 31.1 g/dL (ref 30.0–36.0)
MCV: 83.7 fL (ref 78.0–100.0)
PLATELETS: 127 10*3/uL — AB (ref 150–400)
RBC: 4.49 MIL/uL (ref 3.87–5.11)
RDW: 17.3 % — AB (ref 11.5–15.5)
WBC: 6.6 10*3/uL (ref 4.0–10.5)

## 2015-01-29 LAB — HEMOGLOBIN A1C
HEMOGLOBIN A1C: 6.7 % — AB (ref 4.8–5.6)
MEAN PLASMA GLUCOSE: 146 mg/dL

## 2015-01-29 LAB — C-REACTIVE PROTEIN: CRP: 1.3 mg/dL — ABNORMAL HIGH

## 2015-01-29 MED ORDER — ACETAMINOPHEN 325 MG PO TABS
650.0000 mg | ORAL_TABLET | Freq: Four times a day (QID) | ORAL | Status: DC | PRN
  Administered 2015-01-30 – 2015-02-01 (×4): 650 mg via ORAL
  Filled 2015-01-29 (×4): qty 2

## 2015-01-29 MED ORDER — BRIMONIDINE TARTRATE 0.15 % OP SOLN
1.0000 [drp] | Freq: Three times a day (TID) | OPHTHALMIC | Status: DC
  Administered 2015-01-30 – 2015-02-01 (×8): 1 [drp] via OPHTHALMIC
  Filled 2015-01-29: qty 5

## 2015-01-29 MED ORDER — EXENATIDE 5 MCG/0.02ML ~~LOC~~ SOPN
5.0000 ug | PEN_INJECTOR | Freq: Three times a day (TID) | SUBCUTANEOUS | Status: DC
  Administered 2015-01-30 – 2015-02-01 (×4): 5 ug via SUBCUTANEOUS
  Filled 2015-01-29: qty 1.2

## 2015-01-29 MED ORDER — SIMETHICONE 80 MG PO CHEW: 80.0000 mg | CHEWABLE_TABLET | Freq: Four times a day (QID) | ORAL | Status: DC | PRN

## 2015-01-29 MED ORDER — BUMETANIDE 2 MG PO TABS
2.0000 mg | ORAL_TABLET | Freq: Every day | ORAL | Status: DC
  Administered 2015-01-30 – 2015-01-31 (×2): 2 mg via ORAL
  Filled 2015-01-29 (×3): qty 1

## 2015-01-29 MED ORDER — INSULIN ASPART 100 UNIT/ML ~~LOC~~ SOLN
0.0000 [IU] | SUBCUTANEOUS | Status: DC
  Administered 2015-01-30: 15 [IU] via SUBCUTANEOUS
  Administered 2015-01-30: 4 [IU] via SUBCUTANEOUS
  Administered 2015-01-30: 11 [IU] via SUBCUTANEOUS
  Administered 2015-01-30 (×2): 15 [IU] via SUBCUTANEOUS
  Administered 2015-01-31: 7 [IU] via SUBCUTANEOUS
  Administered 2015-01-31: 3 [IU] via SUBCUTANEOUS
  Administered 2015-01-31: 7 [IU] via SUBCUTANEOUS

## 2015-01-29 MED ORDER — GABAPENTIN 300 MG PO CAPS
300.0000 mg | ORAL_CAPSULE | Freq: Every day | ORAL | Status: DC
  Administered 2015-01-30 – 2015-01-31 (×3): 300 mg via ORAL
  Filled 2015-01-29 (×4): qty 1

## 2015-01-29 MED ORDER — LORATADINE 10 MG PO TABS
10.0000 mg | ORAL_TABLET | Freq: Every day | ORAL | Status: DC
  Administered 2015-01-30 – 2015-02-01 (×4): 10 mg via ORAL
  Filled 2015-01-29 (×3): qty 1

## 2015-01-29 MED ORDER — FENOFIBRATE 54 MG PO TABS
54.0000 mg | ORAL_TABLET | Freq: Every day | ORAL | Status: DC
  Administered 2015-01-30 – 2015-02-01 (×3): 54 mg via ORAL
  Filled 2015-01-29 (×3): qty 1

## 2015-01-29 MED ORDER — PANTOPRAZOLE SODIUM 40 MG PO TBEC
40.0000 mg | DELAYED_RELEASE_TABLET | Freq: Every day | ORAL | Status: DC
  Administered 2015-01-30 – 2015-02-01 (×3): 40 mg via ORAL
  Filled 2015-01-29 (×2): qty 1

## 2015-01-29 MED ORDER — ISOSORBIDE DINITRATE 20 MG PO TABS
20.0000 mg | ORAL_TABLET | Freq: Two times a day (BID) | ORAL | Status: DC
  Administered 2015-01-29 – 2015-02-01 (×6): 20 mg via ORAL
  Filled 2015-01-29 (×7): qty 1

## 2015-01-29 MED ORDER — TRIAMCINOLONE ACETONIDE 0.1 % EX CREA
TOPICAL_CREAM | Freq: Every day | CUTANEOUS | Status: DC
  Administered 2015-02-01: 10:00:00 via TOPICAL
  Filled 2015-01-29: qty 15

## 2015-01-29 MED ORDER — ACETAMINOPHEN 650 MG RE SUPP: 650.0000 mg | Freq: Four times a day (QID) | RECTAL | Status: DC | PRN

## 2015-01-29 MED ORDER — AMLODIPINE BESYLATE 5 MG PO TABS
5.0000 mg | ORAL_TABLET | Freq: Every day | ORAL | Status: DC
  Administered 2015-01-30 – 2015-02-01 (×3): 5 mg via ORAL
  Filled 2015-01-29 (×3): qty 1

## 2015-01-29 MED ORDER — NITROGLYCERIN 0.4 MG SL SUBL: 0.4000 mg | SUBLINGUAL_TABLET | SUBLINGUAL | Status: DC | PRN

## 2015-01-29 MED ORDER — ASPIRIN EC 81 MG PO TBEC
81.0000 mg | DELAYED_RELEASE_TABLET | Freq: Every day | ORAL | Status: DC
  Administered 2015-01-30 – 2015-02-01 (×3): 81 mg via ORAL
  Filled 2015-01-29 (×4): qty 1

## 2015-01-29 MED ORDER — PREDNISONE 50 MG PO TABS
60.0000 mg | ORAL_TABLET | Freq: Once | ORAL | Status: AC
  Administered 2015-01-29: 60 mg via ORAL
  Filled 2015-01-29 (×2): qty 1

## 2015-01-29 MED ORDER — FEBUXOSTAT 40 MG PO TABS
80.0000 mg | ORAL_TABLET | Freq: Every day | ORAL | Status: DC
  Administered 2015-01-30 – 2015-02-01 (×3): 80 mg via ORAL
  Filled 2015-01-29 (×3): qty 2

## 2015-01-29 MED ORDER — SPIRONOLACTONE 25 MG PO TABS
25.0000 mg | ORAL_TABLET | Freq: Two times a day (BID) | ORAL | Status: DC
  Administered 2015-01-30 – 2015-02-01 (×5): 25 mg via ORAL
  Filled 2015-01-29 (×5): qty 1

## 2015-01-29 MED ORDER — CLONIDINE HCL 0.2 MG/24HR TD PTWK
0.2000 mg | MEDICATED_PATCH | TRANSDERMAL | Status: DC
  Filled 2015-01-29: qty 1

## 2015-01-29 MED ORDER — LATANOPROST 0.005 % OP SOLN
1.0000 [drp] | Freq: Every day | OPHTHALMIC | Status: DC
  Administered 2015-01-30 – 2015-01-31 (×3): 1 [drp] via OPHTHALMIC
  Filled 2015-01-29: qty 2.5

## 2015-01-29 MED ORDER — ICOSAPENT ETHYL 1 G PO CAPS: 1.0000 | ORAL_CAPSULE | Freq: Three times a day (TID) | ORAL | Status: DC

## 2015-01-29 MED ORDER — FLUTICASONE PROPIONATE 50 MCG/ACT NA SUSP
1.0000 | Freq: Every day | NASAL | Status: DC
  Administered 2015-01-30 – 2015-02-01 (×3): 1 via NASAL
  Filled 2015-01-29: qty 16

## 2015-01-29 MED ORDER — ONDANSETRON HCL 4 MG PO TABS: 4.0000 mg | ORAL_TABLET | Freq: Four times a day (QID) | ORAL | Status: DC | PRN

## 2015-01-29 MED ORDER — ENOXAPARIN SODIUM 40 MG/0.4ML ~~LOC~~ SOLN
40.0000 mg | SUBCUTANEOUS | Status: DC
  Administered 2015-01-29: 40 mg via SUBCUTANEOUS
  Filled 2015-01-29: qty 0.4

## 2015-01-29 MED ORDER — CLOPIDOGREL BISULFATE 75 MG PO TABS: 75.0000 mg | ORAL_TABLET | Freq: Every day | ORAL | Status: DC

## 2015-01-29 MED ORDER — SOTALOL HCL 80 MG PO TABS
80.0000 mg | ORAL_TABLET | Freq: Two times a day (BID) | ORAL | Status: DC
  Administered 2015-01-29 – 2015-02-01 (×5): 80 mg via ORAL
  Filled 2015-01-29 (×7): qty 1

## 2015-01-29 MED ORDER — ACETAMINOPHEN 500 MG PO TABS
500.0000 mg | ORAL_TABLET | Freq: Four times a day (QID) | ORAL | Status: DC | PRN
Start: 2015-01-29 — End: 2015-01-29

## 2015-01-29 MED ORDER — MECLIZINE HCL 25 MG PO TABS
25.0000 mg | ORAL_TABLET | Freq: Every day | ORAL | Status: DC | PRN
  Filled 2015-01-29: qty 1

## 2015-01-29 MED ORDER — ROSUVASTATIN CALCIUM 20 MG PO TABS
20.0000 mg | ORAL_TABLET | Freq: Every day | ORAL | Status: DC
  Administered 2015-01-30 – 2015-02-01 (×3): 20 mg via ORAL
  Filled 2015-01-29 (×2): qty 1

## 2015-01-29 MED ORDER — OMEGA-3-ACID ETHYL ESTERS 1 G PO CAPS
1.0000 g | ORAL_CAPSULE | Freq: Three times a day (TID) | ORAL | Status: DC
  Administered 2015-01-30 – 2015-02-01 (×8): 1 g via ORAL
  Filled 2015-01-29 (×7): qty 1

## 2015-01-29 MED ORDER — DOCUSATE SODIUM 100 MG PO CAPS: 200.0000 mg | ORAL_CAPSULE | Freq: Every day | ORAL | Status: DC | PRN

## 2015-01-29 MED ORDER — POLYETHYLENE GLYCOL 3350 17 G PO PACK: 17.0000 g | PACK | Freq: Every day | ORAL | Status: DC | PRN

## 2015-01-29 MED ORDER — INSULIN DETEMIR 100 UNIT/ML ~~LOC~~ SOLN
100.0000 [IU] | Freq: Two times a day (BID) | SUBCUTANEOUS | Status: DC
  Filled 2015-01-29 (×2): qty 1

## 2015-01-29 MED ORDER — ONDANSETRON HCL 4 MG/2ML IJ SOLN
4.0000 mg | Freq: Four times a day (QID) | INTRAMUSCULAR | Status: DC | PRN
  Administered 2015-01-31: 4 mg via INTRAVENOUS

## 2015-01-29 NOTE — H&P (Signed)
[facility] History and Physical  Brenda [name] HEN:[id number] DOB: June 17, 1945 DOA: 01/29/2015  Referring physician: ED PCP: Brenda Bolt, MD   Chief Complaint: "Having double vision out of her right eye"  HPI:  Brenda [name] is a 69 year old female with a past medical history significant for CAD, HTN, IDDM, CHF,CKD, cataracts, glaucoma; presents as a direct admit from home at the request of ophthalmologist, Dr. 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 History is obtained from the patient's who is a adequate historian.she reports symptoms of double vision started approximately 3 days ago most notably in the right eye. She reports associated symptoms of sensitivity to light specially at nighttime and right-sided headache. Symptoms have been constant and have not changed since onset.She became concerned and went to see her ophthalmologist  2 days ago in the office. He evaluated her and ran a couple tests. Yesterday his office called and advised her to come to the emergency department for further evaluation. Patient denies being given any steroids. Once in the emergency department lab work of CBC, BMP,  lipid panel, CRP,  and sedimentation rate were obtained. It showed mildly elevated CRP at 1.2, ESR 35. After MRI was obtained which showed no acute abnormalities and the patient was discharged home. After Dr. Maylene [name] reviewed records today at approximately 5:30pm she was again advised to come to the emergency department to be admitted for a possible temporal artery biopsy.    Review of Systems  Constitutional: Negative for weight loss and malaise/fatigue.  HENT: Positive for congestion.   Eyes: Positive for blurred vision and double vision (out of the right eye). Negative for pain.  Respiratory: Negative for cough and hemoptysis.   Cardiovascular: Negative for chest pain and palpitations.  Gastrointestinal: Positive for diarrhea and constipation. Negative for nausea, vomiting and  abdominal pain.  Genitourinary: Negative for frequency and hematuria.  Musculoskeletal: Positive for joint pain.  Skin: Negative for itching and rash.  Neurological: Positive for tingling, sensory change and headaches. Negative for speech change and focal weakness.  Endo/Heme/Allergies: Positive for environmental allergies. Negative for polydipsia.  Psychiatric/Behavioral: Negative for suicidal ideas and substance abuse.      Past Medical History  Diagnosis Date  . Angina at rest [facility])   . Coronary artery disease   . Hypertension   . Diabetes mellitus   . Renal disorder   . CHF (congestive heart failure) ([place])   . Arthritis   . Cirrhosis ([place])   . CAD (coronary artery disease) 02/20/2011  . CHRONIC KIDNEY DISEASE STAGE III (MODERATE) 05/12/2009  . DM 05/12/2009  . Ulcer of lower limb, unspecified 05/12/2009  . Cataract 02/20/2011  . NAFLD (nonalcoholic fatty liver disease) 02/20/2011  . Fibromyalgia   . Glaucoma   . A-fib ([place])   . Thrombocytopenia ([place]) 02/21/2011  . Gout   . Hx of CABG      Past Surgical History  Procedure Laterality Date  . Coronary angioplasty with stent placement    . Coronary artery bypass graft    . Cesarean section    . Appendectomy    . Bunionectomy    . Amputation    . Cholecystectomy    . Dilation and curettage of uterus    . Abdominal hysterectomy    . Neck lesion biopsy    . Ganglion cyst excision    . Left heart catheterization with coronary/graft angiogram N/A 02/22/2011    Procedure: LEFT HEART CATHETERIZATION WITH Beatrix Fetters;  Surgeon: Pixie Casino, MD;  Location:  [facility] CATH LAB;  Service: Cardiovascular;  Laterality: N/A;      Social History:  reports that she has never smoked. She has never used smokeless tobacco. She reports that she does not drink alcohol or use illicit drugs. where does patient live--home   and with whom if at home?Husband Can patient participate in ADLs? yes  Allergies  Allergen Reactions  .  Other Other (See Comments)    STEROIDS-CAUSE EXTREME HYPERGLYCEMIA >500MG/DL  . Aspirin     REACTION: use only enteric coated  . Ciprofloxacin     REACTION: Felt heart palps and "weird"  . Codeine     REACTION: nausea/vomiting  . Fluvastatin Sodium   . Penicillins     REACTION: rash Has patient had a PCN reaction causing immediate rash, facial/tongue/throat swelling, SOB or lightheadedness with hypotension: {Yes Has patient had a PCN reaction causing severe rash involving mucus membranes or skin necrosis: NO Has patient had a PCN reaction that required hospitalization NO Has patient had a PCN reaction occurring within the last 10 years: NO If all of the above answers are "NO", then may proceed with Cephalosporin use.   . Simvastatin Hives  . Sulfonamide Derivatives Nausea Only    Family History  Problem Relation Age of Onset  . Stroke Mother   . Dementia Mother   . Hepatitis Mother   . Arrhythmia Mother   . Heart disease Mother   . Hypertension Mother   . Diabetes Mother   . Hyperlipidemia Mother   . Alzheimer's disease Father   . Pneumonia Father   . Heart disease Father   . Hyperlipidemia Father   . Cancer Brother   . Dementia Brother   . Heart failure Maternal Grandmother   . Heart failure Maternal Grandfather   . Heart failure Paternal Grandmother   . Heart failure Paternal Grandfather        Prior to Admission medications   Medication Sig Start Date End Date Taking? Authorizing Provider  amLODipine (NORVASC) 5 MG tablet Take 5 mg by mouth daily.   Yes Historical Provider, MD  aspirin EC 81 MG tablet Take 81 mg by mouth every evening.    Yes Historical Provider, MD  bimatoprost (LUMIGAN) 0.01 % SOLN Place 1 drop into both eyes at bedtime.     Yes Historical Provider, MD  brimonidine (ALPHAGAN P) 0.1 % SOLN Place 1 drop into both eyes 3 (three) times daily.     Yes Historical Provider, MD  Choline Fenofibrate (TRILIPIX) 45 MG capsule Take 45 mg by mouth every  evening.    Yes Historical Provider, MD  Icosapent Ethyl (VASCEPA) 1 G CAPS Take 1 capsule by mouth 3 (three) times daily.   Yes Historical Provider, MD  isosorbide dinitrate (ISORDIL) 20 MG tablet Take 20 mg by mouth 2 (two) times daily.   Yes Historical Provider, MD  loratadine (CLARITIN) 10 MG tablet Take 10 mg by mouth every evening.    Yes Historical Provider, MD  nitroGLYCERIN (NITROSTAT) 0.4 MG SL tablet Place 0.4 mg under the tongue every 5 (five) minutes as needed. For pain.    Yes Historical Provider, MD  Omega-3 Fatty Acids (FISH OIL PO) Take 1 capsule by mouth 3 (three) times daily.   Yes Historical Provider, MD  rosuvastatin (CRESTOR) 20 MG tablet Take 20 mg by mouth every evening.    Yes Historical Provider, MD  simethicone (MYLICON) 315 MG chewable tablet Chew 125 mg by mouth at bedtime.    Yes Historical  Provider, MD  sotalol (BETAPACE) 80 MG tablet Take 80 mg by mouth 2 (two) times daily.     Yes Historical Provider, MD  acetaminophen (TYLENOL) 500 MG tablet Take 500 mg by mouth every 6 (six) hours as needed. For pain.     Historical Provider, MD  betamethasone valerate (VALISONE) 0.1 % cream Apply 1 application topically daily as needed. For rash    Historical Provider, MD  bumetanide (BUMEX) 2 MG tablet Take 2 mg by mouth daily.      Historical Provider, MD  cloNIDine (CATAPRES - DOSED IN MG/24 HR) 0.2 mg/24hr patch Place 0.2 mg onto the skin once a week. Every Saturday    Historical Provider, MD  clopidogrel (PLAVIX) 75 MG tablet Take 75 mg by mouth daily.    Historical Provider, MD  docusate sodium (COLACE) 100 MG capsule Take 200 mg by mouth daily as needed for mild constipation.     Historical Provider, MD  esomeprazole (NEXIUM) 40 MG capsule Take 40 mg by mouth daily before breakfast.      Historical Provider, MD  exenatide (BYETTA 5 MCG PEN) 5 MCG/0.02ML SOLN Inject 5 mcg into the skin 3 (three) times daily as needed (ONLY USES AS NEEDED FOR HYPERGLYCEMIC EVENTS).      Historical Provider, MD  febuxostat (ULORIC) 40 MG tablet Take 40 mg by mouth daily.    Historical Provider, MD  gabapentin (NEURONTIN) 300 MG capsule Take 300 mg by mouth at bedtime.    Historical Provider, MD  insulin detemir (LEVEMIR) 100 UNIT/ML injection Inject 100 Units into the skin 2 (two) times daily.      Historical Provider, MD  insulin lispro (HUMALOG) 100 UNIT/ML injection Inject 100 Units into the skin 3 (three) times daily before meals. Sliding scale. Patient states she did take 100 units today. Patient states she knows that's a lot of insulin however she had to take it per patient    Historical Provider, MD  isosorbide mononitrate (IMDUR) 30 MG 24 hr tablet Take 1 tablet (30 mg total) by mouth daily. 02/23/11 02/23/12  Brett Canales, PA-C  meclizine (ANTIVERT) 25 MG tablet Take 25 mg by mouth daily as needed for dizziness.     Historical Provider, MD  mometasone (NASONEX) 50 MCG/ACT nasal spray Place 2 sprays into the nose daily.      Historical Provider, MD  spironolactone (ALDACTONE) 25 MG tablet Take 25 mg by mouth 2 (two) times daily.    Historical Provider, MD     Physical Exam: Filed Vitals:   01/29/15 1958 01/29/15 2159  BP: 140/66 139/44  Pulse: 76 65  Temp: 98.1 F (36.7 C) 98.3 F (36.8 C)  TempSrc: Oral Oral  Resp: 20 18  SpO2: 97% 99%    Constitutional: Vital signs reviewed. Patient is a well-developed and well-nourished in no acute distress and cooperative with exam. Alert and oriented x3.  Head: Normocephalic and atraumatic. Patient notes mild discomfort with palpation of the right temporal region Ear: TM normal bilaterally  Mouth: no erythema or exudates, MMM  Eyes: PERRL, EOMI, conjunctivae normal, No scleral icterus. Funduscopic exam is hard to performed cannot completely visualize all vessels.patient notes that she sees double when standing on the right side of her body. Neck: Supple, Trachea midline normal ROM, No JVD, mass, thyromegaly, or carotid bruit  present.  Cardiovascular: RRR, S1 normal, S2 normal, no MRG, pulses symmetric and intact bilaterally  Pulmonary/Chest: CTAB, no wheezes, rales, or rhonchi  Abdominal: Soft. Non-tender, non-distended, bowel  sounds are normal, no masses, organomegaly, or guarding present.  GU: no CVA tenderness Musculoskeletal: No joint deformities, erythema, or stiffness, ROM full and no nontender Ext: patient has amputation of first and second digit on the right foot. no edema and no cyanosis, pulses palpable bilaterally (DP and PT)  Hematology: no cervical, inginal, or axillary adenopathy.  Neurological: A&O x3, Strenght is normal and symmetric bilaterally, cranial nerve II-XII are grossly intact, no focal motor deficit, impaired sensation to light touch on the bilateral lower extremities. Skin: Warm, dry and intact. No rash, cyanosis, or clubbing.  Psychiatric: Normal mood and affect. speech and behavior is normal. Judgment and thought content normal. Cognition and memory are normal.      Data Review   Micro Results No results found for this or any previous visit (from the past 240 hour(s)).  Radiology Reports [name] Contrast  01/28/2015  CLINICAL DATA:  Blurred vision.  Diplopia. EXAM: MRI HEAD WITHOUT CONTRAST TECHNIQUE: Multiplanar, multiecho pulse sequences of the brain and surrounding structures were obtained without intravenous contrast. COMPARISON:  None. FINDINGS: Ventricle size normal. Cerebral volume normal. Pituitary not enlarged. No compression of the optic chiasm. Negative for acute infarct. Small hyperintensities in the right parietal white matter consistent with mild chronic microvascular ischemia. Brainstem and basal ganglia normal. Negative for intracranial hemorrhage.  Negative for mass or edema Paranasal sinuses clear.  Normal orbit. IMPRESSION: No acute intracranial abnormality. Mild chronic microvascular ischemic change in the white matter. Electronically Signed   By: Franchot Gallo  M.D.   On: 01/28/2015 20:13     CBC  Recent Labs Lab 01/28/15 1602 01/29/15 2206  WBC 5.8 6.6  HGB 12.1 11.7*  HCT 39.2 37.6  PLT 121* 127*  MCV 84.1 83.7  MCH 26.0 26.1  MCHC 30.9 31.1  RDW 17.4* 17.3*  LYMPHSABS 1.3  --   MONOABS 0.6  --   EOSABS 0.3  --   BASOSABS 0.0  --     Chemistries   Recent Labs Lab 01/28/15 1602  NA 140  K 4.1  CL 101  CO2 30  GLUCOSE 119*  BUN 34*  CREATININE 1.48*  CALCIUM 9.7   ------------------------------------------------------------------------------------------------------------------ CrCl cannot be calculated (Unknown ideal weight.). ------------------------------------------------------------------------------------------------------------------  Recent Labs  01/28/15 2033  HGBA1C 6.7*   ------------------------------------------------------------------------------------------------------------------  Recent Labs  01/28/15 2033  CHOL 104  HDL 32*  LDLCALC 42  TRIG 148  CHOLHDL 3.3   ------------------------------------------------------------------------------------------------------------------ No results for input(s): TSH, T4TOTAL, T3FREE, THYROIDAB in the last 72 hours.  Invalid input(s): FREET3 ------------------------------------------------------------------------------------------------------------------ No results for input(s): VITAMINB12, FOLATE, FERRITIN, TIBC, IRON, RETICCTPCT in the last 72 hours.  Coagulation profile No results for input(s): INR, PROTIME in the last 168 hours.  No results for input(s): DDIMER in the last 72 hours.  Cardiac Enzymes No results for input(s): CKMB, TROPONINI, MYOGLOBIN in the last 168 hours.  Invalid input(s): CK ------------------------------------------------------------------------------------------------------------------ Invalid input(s): POCBNP   CBG:  Recent Labs Lab 01/28/15 1906  GLUCAP 70          Assessment/Plan  Suspected Temporal  arteritis: Acute. Patient with complaints of double vision out of the right eye. Previous seen in ED on11/10/2014  where MRI  Was negative for any acute abnormality. Thereafter was advised to come back to the hospital a direct admit by Dr. Maylene [name] for need of a temporal artery biopsy. Sedimentation rate /CRP were mildly elevated. -Patient started on 60 mg of prednisone by mouth daily -Will consult vascular surgery in a.m. -  to be nothing by mouth after midnight  Diabetes mellitus type 2 in obese: Patient notes history of insulin resistance although last hemoglobin A1c was noted to be <7. patient notes -CBG every 4 hours - Levemir per patient's home dose  -Sliding-scale insulin per ressistant scale  -Patient to be continued on home [place]  Glaucoma: Stable -Continue home regimen of eyedrops   CKD stage III: stable.Cr 1.48 two days ago and creatinine appear somewhat similar on admission today at 1.39  CAD (coronary artery disease) stable - Continue Plavix, aspirin , Crestor, isosorbide mononitrate, prn nitroglycerin  Hypertension: stable -Continue clonidine, amlodipine,  NAFLD (nonalcoholic fatty liver disease) -Continue Bumex, spironolactone    Code Status:   full Family Communication: bedside Disposition Plan: admit   Total time spent 55 minutes.Greater than 50% of this time was spent in counseling, explanation of diagnosis, planning of further management, and coordination of care  [facility] Pager [phone]  If 7PM-7AM, please contact night-coverage [email or web address] Password [facility] 01/29/2015, 10:42 PM

## 2015-01-30 ENCOUNTER — Encounter (HOSPITAL_COMMUNITY): Payer: Self-pay | Admitting: General Practice

## 2015-01-30 DIAGNOSIS — K760 Fatty (change of) liver, not elsewhere classified: Secondary | ICD-10-CM | POA: Diagnosis not present

## 2015-01-30 DIAGNOSIS — H532 Diplopia: Principal | ICD-10-CM

## 2015-01-30 DIAGNOSIS — I5032 Chronic diastolic (congestive) heart failure: Secondary | ICD-10-CM | POA: Diagnosis not present

## 2015-01-30 DIAGNOSIS — E119 Type 2 diabetes mellitus without complications: Secondary | ICD-10-CM | POA: Diagnosis not present

## 2015-01-30 DIAGNOSIS — I25110 Atherosclerotic heart disease of native coronary artery with unstable angina pectoris: Secondary | ICD-10-CM | POA: Diagnosis not present

## 2015-01-30 DIAGNOSIS — N183 Chronic kidney disease, stage 3 (moderate): Secondary | ICD-10-CM | POA: Diagnosis not present

## 2015-01-30 DIAGNOSIS — E669 Obesity, unspecified: Secondary | ICD-10-CM

## 2015-01-30 DIAGNOSIS — N179 Acute kidney failure, unspecified: Secondary | ICD-10-CM | POA: Diagnosis not present

## 2015-01-30 DIAGNOSIS — R51 Headache: Secondary | ICD-10-CM | POA: Diagnosis not present

## 2015-01-30 LAB — BASIC METABOLIC PANEL
ANION GAP: 9 (ref 5–15)
BUN: 36 mg/dL — ABNORMAL HIGH (ref 6–20)
CALCIUM: 9.3 mg/dL (ref 8.9–10.3)
CHLORIDE: 100 mmol/L — AB (ref 101–111)
CO2: 25 mmol/L (ref 22–32)
Creatinine, Ser: 1.48 mg/dL — ABNORMAL HIGH (ref 0.44–1.00)
GFR calc non Af Amer: 35 mL/min — ABNORMAL LOW (ref >60)
GFR, EST AFRICAN AMERICAN: 41 mL/min — AB (ref >60)
Glucose, Bld: 256 mg/dL — ABNORMAL HIGH (ref 65–99)
Potassium: 4.4 mmol/L (ref 3.5–5.1)
Sodium: 134 mmol/L — ABNORMAL LOW (ref 135–145)

## 2015-01-30 LAB — GLUCOSE, CAPILLARY
GLUCOSE-CAPILLARY: 200 mg/dL — AB (ref 65–99)
GLUCOSE-CAPILLARY: 316 mg/dL — AB (ref 65–99)
Glucose-Capillary: 139 mg/dL — ABNORMAL HIGH (ref 65–99)
Glucose-Capillary: 278 mg/dL — ABNORMAL HIGH (ref 65–99)
Glucose-Capillary: 320 mg/dL — ABNORMAL HIGH (ref 65–99)
Glucose-Capillary: 321 mg/dL — ABNORMAL HIGH (ref 65–99)

## 2015-01-30 MED ORDER — VANCOMYCIN HCL 10 G IV SOLR: 1500.0000 mg | INTRAVENOUS | Status: DC

## 2015-01-30 MED ORDER — TIMOLOL MALEATE 0.5 % OP SOLN
1.0000 [drp] | Freq: Every day | OPHTHALMIC | Status: DC
  Administered 2015-01-30 – 2015-02-01 (×3): 1 [drp] via OPHTHALMIC
  Filled 2015-01-30: qty 5

## 2015-01-30 MED ORDER — INSULIN DETEMIR 100 UNIT/ML ~~LOC~~ SOLN
50.0000 [IU] | Freq: Two times a day (BID) | SUBCUTANEOUS | Status: DC
  Administered 2015-01-30 – 2015-02-01 (×5): 50 [IU] via SUBCUTANEOUS
  Filled 2015-01-30 (×6): qty 0.5

## 2015-01-30 MED ORDER — PREDNISONE 50 MG PO TABS
50.0000 mg | ORAL_TABLET | ORAL | Status: DC
  Administered 2015-01-31: 50 mg via ORAL
  Filled 2015-01-30: qty 1

## 2015-01-30 MED ORDER — VANCOMYCIN HCL 500 MG IV SOLR: 500.0000 mg | INTRAVENOUS | Status: DC

## 2015-01-30 MED ORDER — PREDNISONE 50 MG PO TABS: 50.0000 mg | ORAL_TABLET | Freq: Every day | ORAL | Status: DC

## 2015-01-30 NOTE — Care Management Note (Addendum)
Case Management Note  Patient Details  Name: Brenda [name] MRN: [id number] Date of Birth: September 09, 1945  Subjective/Objective:                  Date-01-30-15 Initial Assessment Spoke with patient at the bedside.  Introduced self as Sports coachcase manager and explained role in discharge planning and how to be reached.  Verified patient anticipates to go home with spouse,  at time of discharge.  Patient has DME cane walker. Expressed potential need for no other DME.  Patient denied confirmed  needing help with their medication.  Patient is driven by husband to MD appointments.  Verified patient has PCP [place].  Plan: CM will continue to follow for discharge planning and [facility].   Lawerance Sabalebbie Tamra Koos RN BSN CM [phone]   Action/Plan:   Expected Discharge Date:                  Expected Discharge Plan:  Home/Self Care  In-House Referral:     Discharge planning Services     Post Acute Care Choice:    Choice offered to:     DME Arranged:    DME Agency:     HH Arranged:    HH Agency:     Status of Service:  In process, will continue to follow  Medicare Important Message Given:    Date Medicare IM Given:    Medicare IM give by:    Date Additional Medicare IM Given:    Additional Medicare Important Message give by:     If discussed at Long Length of Stay Meetings, dates discussed:    Additional Comments:  Lawerance SabalDebbie Meekah Math, RN 01/30/2015, 10:46 AM

## 2015-01-30 NOTE — Consult Note (Signed)
Vascular and Vein [facility] Consult  Reason for Consult:  Biopsy for possible temporal arteritis Referring Physician:  Dr. Arbutus Leas ([facility]) MRN #:  [id number]  History of Present Illness: This is a 69 y.o. female who we've been consulted for right temporal artery biopsy. The patient reports a history of three month right eye pain that has worsened in the past two weeks. She has a history of glaucoma and left eye cataract surgery (August 2016). The patient has a history of chronic headaches, but has complained of worsening right sided headaches around the right parietal area starting last week. She has also complained of right sided diplopia for the past four days. She saw her ophthalmologist, Dr. Alvino Chapel on 01/27/15 who sent her to the ED for evaluation due to concerns for temporal arteritis. Her sed rate was 35 (ref: 0-22) and CRP 1.3 (ref <1.0). Neurology was consulted and an MRI was performed revealing no acute findings and she was discharged home. She was contacted by her ophthalmologist the following day due to continued concerns for temporal arteritis and the patient was directly admitted.   The patient denies any temporary blindness, fevers, abnormal fatigue (fatigue at baseline), weight loss, jaw claudication or scalp tenderness.   She has a past medical history of CAD s/p CABG 2005 and multiple stent placements, CHF, insulin dependent diabetes, hypertension and CKD stage III. She has no prior history of CVA.   Past Medical History  Diagnosis Date  . Angina at rest [facility])   . Coronary artery disease   . Hypertension   . CHF (congestive heart failure) (HCC)   . Cirrhosis (HCC)   . CAD (coronary artery disease) 02/20/2011  . Ulcer of lower limb, unspecified 05/12/2009  . NAFLD (nonalcoholic fatty liver disease) 40/11/8117  . Fibromyalgia   . Glaucoma, both eyes   . A-fib (HCC)   . Thrombocytopenia (HCC) 02/21/2011  . Gout   . Hyperlipemia   . DVT (deep venous thrombosis)  (HCC) ~ 2002    RLE  . Sleep apnea   . Type II diabetes mellitus (HCC)   . History of blood transfusion 2005    "related to bypass OR"  . GERD (gastroesophageal reflux disease)   . Jaundice ~ 1952    "yellow jaundice"  . Headache     "~ q week" (01/30/2015)  . Arthritis     "back, knees, feet" (01/30/2015)  . Chronic low back pain     "into hips"  . Renal disorder   . CHRONIC KIDNEY DISEASE STAGE III (MODERATE) 05/12/2009  . Diabetic peripheral neuropathy [facility])    Past Surgical History  Procedure Laterality Date  . Coronary angioplasty with stent placement  "lots"  . Coronary artery bypass graft  2005    'CABG X4"  . Cesarean section  1974  . Bunionectomy with hammertoe reconstruction Bilateral   . Toe amputation Left 2008-2011    1st and 2nd digits  . Dilation and curettage of uterus  "several"  . Abdominal hysterectomy  1980  . Neck lesion biopsy Left 1980's  . Ganglion cyst excision Right ~ 1965  . Left heart catheterization with coronary/graft angiogram N/A 02/22/2011    Procedure: LEFT HEART CATHETERIZATION WITH Isabel Caprice;  Surgeon: Chrystie Nose, MD;  Location: [facility] CATH LAB;  Service: Cardiovascular;  Laterality: N/A;  . Tonsillectomy and adenoidectomy  ~ 1961  . Appendectomy  ~ 1971  . Cholecystectomy open  ~ 1971  . Excisional hemorrhoidectomy  ~ 1966  .  Pilonidal cyst excision  ~ 1966  . Anal fissure repair  ~ 1966    "w/fistula"  . Cataract extraction w/ intraocular lens implant Left 11/06/2014  . Wisdom tooth extraction  ~ 1972    "all at one time"    Allergies  Allergen Reactions  . Other Other (See Comments)    STEROIDS-CAUSE EXTREME HYPERGLYCEMIA >500MG /DL  . Ciprofloxacin Palpitations    REACTION: Felt heart palps and "weird"  . Codeine Nausea And Vomiting  . Penicillins Other (See Comments)    REACTION: rash Has patient had a PCN reaction causing immediate rash, facial/tongue/throat swelling, SOB or lightheadedness with hypotension:  {Yes Has patient had a PCN reaction causing severe rash involving mucus membranes or skin necrosis: NO Has patient had a PCN reaction that required hospitalization NO Has patient had a PCN reaction occurring within the last 10 years: NO If all of the above answers are "NO", then may proceed with Cephalosporin use.   . Simvastatin Hives  . Sulfonamide Derivatives Nausea Only  . Aspirin Other (See Comments)    REACTION: use only enteric coated  . Fluvastatin Sodium Other (See Comments)    Prior to Admission medications   Medication Sig Start Date End Date Taking? Authorizing Provider  acetaminophen (TYLENOL) 500 MG tablet Take 1,000 mg by mouth every 8 (eight) hours as needed for moderate pain. For pain.   Yes Historical Provider, MD  amLODipine (NORVASC) 5 MG tablet Take 5 mg by mouth daily.   Yes Historical Provider, MD  aspirin EC 81 MG tablet Take 81 mg by mouth every evening.    Yes Historical Provider, MD  betamethasone valerate (VALISONE) 0.1 % cream Apply 1 application topically daily as needed. For rash   Yes Historical Provider, MD  bimatoprost (LUMIGAN) 0.01 % SOLN Place 1 drop into both eyes at bedtime.     Yes Historical Provider, MD  brimonidine (ALPHAGAN P) 0.1 % SOLN Place 1 drop into both eyes 3 (three) times daily.     Yes Historical Provider, MD  bumetanide (BUMEX) 2 MG tablet Take 2 mg by mouth daily.     Yes Historical Provider, MD  Choline Fenofibrate (TRILIPIX) 45 MG capsule Take 45 mg by mouth every evening.    Yes Historical Provider, MD  cloNIDine (CATAPRES - DOSED IN MG/24 HR) 0.2 mg/24hr patch Place 0.2 mg onto the skin once a week. Every Saturday   Yes Historical Provider, MD  clopidogrel (PLAVIX) 75 MG tablet Take 75 mg by mouth daily.   Yes Historical Provider, MD  docusate sodium (COLACE) 100 MG capsule Take 100 mg by mouth daily as needed for mild constipation.    Yes Historical Provider, MD  esomeprazole (NEXIUM) 40 MG capsule Take 40 mg by mouth daily before  breakfast.     Yes Historical Provider, MD  exenatide (BYETTA 5 MCG PEN) 5 MCG/0.02ML SOLN Inject 5 mcg into the skin 3 (three) times daily as needed (ONLY USES AS NEEDED FOR HYPERGLYCEMIC EVENTS).    Yes Historical Provider, MD  febuxostat (ULORIC) 40 MG tablet Take 40 mg by mouth daily.   Yes Historical Provider, MD  gabapentin (NEURONTIN) 300 MG capsule Take 300 mg by mouth at bedtime.   Yes Historical Provider, MD  Icosapent Ethyl (VASCEPA) 1 G CAPS Take 1 capsule by mouth 3 (three) times daily.   Yes Historical Provider, MD  insulin detemir (LEVEMIR) 100 UNIT/ML injection Inject 100 Units into the skin 2 (two) times daily.     Yes  Historical Provider, MD  insulin lispro (HUMALOG) 100 UNIT/ML injection Inject 20-40 Units into the skin 3 (three) times daily as needed for high blood sugar. Sliding scale.   Yes Historical Provider, MD  isosorbide dinitrate (ISORDIL) 20 MG tablet Take 20 mg by mouth 2 (two) times daily.   Yes Historical Provider, MD  loratadine (CLARITIN) 10 MG tablet Take 10 mg by mouth every evening.    Yes Historical Provider, MD  meclizine (ANTIVERT) 25 MG tablet Take 25 mg by mouth daily as needed for dizziness.    Yes Historical Provider, MD  mometasone (NASONEX) 50 MCG/ACT nasal spray Place 2 sprays into the nose daily.     Yes Historical Provider, MD  nitroGLYCERIN (NITROSTAT) 0.4 MG SL tablet Place 0.4 mg under the tongue every 5 (five) minutes as needed. For pain.    Yes Historical Provider, MD  rosuvastatin (CRESTOR) 20 MG tablet Take 20 mg by mouth every evening.    Yes Historical Provider, MD  simethicone (MYLICON) 125 MG chewable tablet Chew 125 mg by mouth at bedtime.    Yes Historical Provider, MD  sotalol (BETAPACE) 80 MG tablet Take 80 mg by mouth 2 (two) times daily.     Yes Historical Provider, MD  spironolactone (ALDACTONE) 25 MG tablet Take 25 mg by mouth 2 (two) times daily.   Yes Historical Provider, MD  Timolol Maleate (ISTALOL) 0.5 % (DAILY) SOLN Apply 1  drop to eye every morning.   Yes Historical Provider, MD    Social History   Social History  . Marital Status: Married    Spouse Name: N/A  . Number of Children: N/A  . Years of Education: N/A   Occupational History  . Not on file.   Social History Main Topics  . Smoking status: Never Smoker   . Smokeless tobacco: Never Used  . Alcohol Use: No  . Drug Use: No  . Sexual Activity: Not Currently   Other Topics Concern  . Not on file   Social History Narrative    Family History  Problem Relation Age of Onset  . Stroke Mother   . Dementia Mother   . Hepatitis Mother   . Arrhythmia Mother   . Heart disease Mother   . Hypertension Mother   . Diabetes Mother   . Hyperlipidemia Mother   . Alzheimer's disease Father   . Pneumonia Father   . Heart disease Father   . Hyperlipidemia Father   . Cancer Brother   . Dementia Brother   . Heart failure Maternal Grandmother   . Heart failure Maternal Grandfather   . Heart failure Paternal Grandmother   . Heart failure Paternal Grandfather     ROS: [x]  Positive   [ ]  Negative   [ ]  All sytems reviewed and are negative  Cardiovascular: []  chest pain/pressure []  palpitations [x]  SOB lying flat [x]  DOE []  pain in legs while walking []  pain in legs at rest []  pain in legs at night []  non-healing ulcers []  hx of DVT []  swelling in legs  Pulmonary: []  productive cough []  asthma/wheezing []  home O2  Neurologic: []  weakness in []  arms []  legs []  numbness in []  arms []  legs []  hx of CVA []  mini stroke [] difficulty speaking or slurred speech []  temporary loss of vision in one eye []  dizziness  Hematologic: []  hx of cancer []  bleeding problems []  problems with blood clotting easily  Endocrine:   [x]  diabetes []  thyroid disease  GI []  vomiting blood []   blood in stool  GU:  CKD/renal failure  HD--[]  M/W/F or  T/T/S  burning with urination  blood in urine  Psychiatric:  anxiety   depression  Musculoskeletal:  arthritis  joint pain  Integumentary:  rashes  ulcers  Constitutional:  fever  chills   Physical Examination  Filed Vitals:   01/30/15 1020  BP: 131/55  Pulse: 63  Temp:   Resp:    Body mass index is 37.3 kg/(m^2).  General:  WDWN obese female in NAD Gait: Not observed HENT: WNL, normocephalic Eyes: EOMI, reports right sided diploplia Pulmonary: normal non-labored breathing, clear to auscultation bilaterally  Cardiac: regular, without  Murmurs, rubs or gallops Abdomen: soft, obese, NT/ND, no masses Vascular Exam/Pulses: 2+ radial pulses symmetric, palpable carotids with no bruits detected, palpable dorsalis pedis pulses bilaterally Extremities: no ischemic changes,  left 1st and 2nd toes amputated.  Musculoskeletal: no muscle wasting or atrophy  Neurologic: A&O X 3; Appropriate Affect ; SENSATION: normal; MOTOR FUNCTION:  moving all extremities equally. Speech is fluent/normal   CBC    Component Value Date/Time   WBC 6.6 01/29/2015 2206   RBC 4.49 01/29/2015 2206   RBC 3.80* 02/22/2011 0720   HGB 11.7* 01/29/2015 2206   HCT 37.6 01/29/2015 2206   PLT 127* 01/29/2015 2206   MCV 83.7 01/29/2015 2206   MCH 26.1 01/29/2015 2206   MCHC 31.1 01/29/2015 2206   RDW 17.3* 01/29/2015 2206   LYMPHSABS 1.3 01/28/2015 1602   MONOABS 0.6 01/28/2015 1602   EOSABS 0.3 01/28/2015 1602   BASOSABS 0.0 01/28/2015 1602    BMET    Component Value Date/Time   NA 134* 01/30/2015 0540   K 4.4 01/30/2015 0540   CL 100* 01/30/2015 0540   CO2 25 01/30/2015 0540   GLUCOSE 256* 01/30/2015 0540   BUN 36* 01/30/2015 0540   CREATININE 1.48* 01/30/2015 0540   CALCIUM 9.3 01/30/2015 0540   GFRNONAA 35* 01/30/2015 0540   GFRAA 41* 01/30/2015 0540    COAGS: Lab Results  Component Value Date   INR 1.12 02/20/2011   INR 0.99 10/06/2009   INR 1.09 10/04/2009    Statin:  Yes.   Beta Blocker:  Yes.   Aspirin:  Yes.   ACEI:  No. ARB:   No. Other antiplatelets/anticoagulants:  Yes.   Plavix.    ASSESSMENT: This is a 69 y.o. female with right eye diplopia and right sided headache concerning for possible temporal arteritis.   PLAN: -Prednisone has been started.  -Plan for temporal artery biopsy tomorrow.   -NPO past midnight. Obtain consent.     Maris Berger, PA-C [facility] Office: [phone] Pager: [phone]  Agree with above. Plan right temporal artery biopsy tomorrow. Discussed with patient. All questions answered.  Waverly Ferrari, MD, FACS Beeper [phone] Office: [phone]

## 2015-01-30 NOTE — Progress Notes (Signed)
PROGRESS NOTE  Brenda [name] UEA:[id number] DOB: 10/14/1945 DOA: 01/29/2015 PCP: Michiel Sites, MD  Brief History 69 year old female with a history of coronary artery disease with CABG April 2005, cataracts, hypertension, diabetes mellitus, CKD stage III, CHF, former tobacco use quit 1999 presented with worsening right eye pain, right temporal parietal headache, and diplopia. Apparently, the patient has had a history of cataracts having the left eye repaired in August 2016. She relates blurry vision in her right eye since August 2016, but stated that her symptoms worsened beginning 01/24/2015. Once again, the patient has also been complaining of chronic headaches, temporal parietal area, right and left but states that her symptoms had worsened since 01/24/2015. She went to see her ophthalmologist, Dr. Alvino Chapel,  on 01/27/2015. Apparently there was some concern for temporal arteritis. The patient was sent to the emergency department. In the emergency department on 01/28/2015, sedimentation rate was 35 with CRP 1.2. Neurology was consulted in the emergency department where an MRI of the brain was obtained.  There was concern for brainstem infarct as contributing to her symptoms. MRI of the brain was negative for any acute findings, and the patient was sent home from the emergency department. The patient was once again contacted by her ophthalmologist on the following day with continued concern for temporal arteritis. As a result, the hospitalist service was contacted for direct admission.   Assessment/Plan:  Diplopia/headache/blurry vision  -The symptoms have been present for the last 3-4 months, with subjective worsening since 01/24/2015  -CRP and sedimentation rate are minimally elevated for the patient's age  -Based upon clinical and laboratory evidence, as patient remains fairly low for temporal arteritis  -Nevertheless, consult vascular surgery for temporal artery biopsy  -Continue  prednisone for now  -If there is no symptom resolution or improvement within one week, this would further argue against temporal arteritis  -I have consulted vascular surgery for biopsy  Diabetes mellitus type 2  -Anticipate worsening glycemic control secondary to prednisone  -Adjust Levemir and NovoLog as needed  -01/28/2015 hemoglobin A1c 6.7  Thrombocytopenia  -Has been chronic dating back to 05/07/2009  -check B12 Coronary artery disease  -Status post CABG April 2005  -Patient relates history of stents over 86 years old  -Hold Plavix in preparation for biopsy  -Continue aspirin  -02/22/2011 heart catheterization--3 vessel native disease with patent grafts--it was thought the patient's chest pain was secondary to her fibromyalgia  Chronic diastolic CHF  -Euvolemic presently  -Continue home dose Bumex  -Continue spironolactone -Daily weights  Atrial fibrillation  -Presently in sinus rhythm  -Continue Betapace  Hypertension  -Continue clonidine and amlodipine  CKD stage III  -Baseline creatinine 1.1-1.4  Hyperlipidemia  -Continue statin   Family Communication:   Pt at beside Disposition Plan:   Home when medically stable       Procedures/Studies: [name] Contrast  01/28/2015  CLINICAL DATA:  Blurred vision.  Diplopia. EXAM: MRI HEAD WITHOUT CONTRAST TECHNIQUE: Multiplanar, multiecho pulse sequences of the brain and surrounding structures were obtained without intravenous contrast. COMPARISON:  None. FINDINGS: Ventricle size normal. Cerebral volume normal. Pituitary not enlarged. No compression of the optic chiasm. Negative for acute infarct. Small hyperintensities in the right parietal white matter consistent with mild chronic microvascular ischemia. Brainstem and basal ganglia normal. Negative for intracranial hemorrhage.  Negative for mass or edema Paranasal sinuses clear.  Normal orbit. IMPRESSION: No acute intracranial abnormality. Mild chronic microvascular  ischemic change in  the white matter. Electronically Signed   By: Marlan Palau M.D.   On: 01/28/2015 20:13         Subjective:  patient complains of right-sided headache and blurred vision not worse than the past several days. Denies fevers, chills, chest discomfort, shortness breath, nausea, vomiting, diarrhea, abdominal pain. No dysuria or hematuria.   Objective: Filed Vitals:   01/29/15 1958 01/29/15 2159 01/30/15 0551  BP: 140/66 139/44 133/43  Pulse: 76 65 69  Temp: 98.1 F (36.7 C) 98.3 F (36.8 C) 97.7 F (36.5 C)  TempSrc: Oral Oral Oral  Resp: Weight:   104.781 kg (231 lb)  SpO2: 97% 99% 95%    Intake/Output Summary (Last 24 hours) at 01/30/15 0826 Last data filed at 01/30/15 0533  Gross per 24 hour  Intake    220 ml  Output    400 ml  Net   -180 ml   Weight change:  Exam:   General:  Pt is alert, follows commands appropriately, not in acute distress  HEENT: No icterus, No thrush, No neck mass, [place]/AT; minimal tenderness to palpation of right temporal parietal area   Cardiovascular: RRR, S1/S2, no rubs, no gallops  Respiratory: CTA bilaterally, no wheezing, no crackles, no rhonchi  Abdomen: Soft/+BS, non tender, non distended, no guarding  Extremities: No edema, No lymphangitis, No petechiae, No rashes, no synovitis  Data Reviewed: Basic Metabolic Panel:  Recent Labs Lab 01/28/15 1602 01/29/15 2206 01/30/15 0540  NA 140 137 134*  K 4.1 3.6 4.4  CL 101 101 100*  CO2 GLUCOSE 119* 83 256*  BUN 34* 34* 36*  CREATININE 1.48* 1.39* 1.48*  CALCIUM 9.7 9.5 9.3   Liver Function Tests:  Recent Labs Lab 01/29/15 2206  AST 34  ALT 21  ALKPHOS 77  BILITOT 0.5  PROT 7.1  ALBUMIN 3.4*   No results for input(s): LIPASE, AMYLASE in the last 168 hours. No results for input(s): AMMONIA in the last 168 hours. CBC:  Recent Labs Lab 01/28/15 1602 01/29/15 2206  WBC 5.8 6.6  NEUTROABS 3.6  --   HGB 12.1 11.7*  HCT 39.2  37.6  MCV 84.1 83.7  PLT 121* 127*   Cardiac Enzymes: No results for input(s): CKTOTAL, CKMB, CKMBINDEX, TROPONINI in the last 168 hours. BNP: Invalid input(s): POCBNP CBG:  Recent Labs Lab 01/28/15 1906 01/29/15 2242 01/29/15 2354 01/30/15 0348  GLUCAP 70 83 139* 200*    No results found for this or any previous visit (from the past 240 hour(s)).   Scheduled Meds: . amLODipine  5 mg Oral Daily  . aspirin EC  81 mg Oral Daily  . brimonidine  1 drop Both Eyes TID  . bumetanide  2 mg Oral Daily  . cloNIDine  0.2 mg Transdermal Weekly  . exenatide  5 mcg Subcutaneous TID WC  . febuxostat  80 mg Oral Daily  . fenofibrate  54 mg Oral Daily  . fluticasone  1 spray Each Nare Daily  . gabapentin  300 mg Oral QHS  . insulin aspart  0-20 Units Subcutaneous 6 times per day  . insulin detemir  50 Units Subcutaneous BID  . isosorbide dinitrate  20 mg Oral BID  . latanoprost  1 drop Both Eyes QHS  . loratadine  10 mg Oral Daily  . omega-3 acid ethyl esters  1 g Oral TID  . pantoprazole  40 mg Oral Daily  . rosuvastatin  20 mg Oral  Daily  . sotalol  80 mg Oral BID  . spironolactone  25 mg Oral BID  . triamcinolone cream   Topical Daily   Continuous Infusions:    Meric Joye, DO  [facility] Pager [phone]  If 7PM-7AM, please contact night-coverage [email or web address] Password [email or web address] 01/30/2015, 8:26 AM   LOS: 1 day

## 2015-01-30 NOTE — Progress Notes (Signed)
Reviewed Code 44 papers with patient. Questions answered by CM and patient signed. Original kept by patient and copy given to CMAt. Care Management form signed and placed in shadow chart.

## 2015-01-30 NOTE — Progress Notes (Signed)
Pt was admitted @ 2019, she and husband oriented to unit and equipment call light and telephone placed within reached

## 2015-01-30 NOTE — Progress Notes (Signed)
UR Completed Reya Aurich Graves-Bigelow, RN,BSN [phone]  

## 2015-01-31 ENCOUNTER — Encounter (HOSPITAL_COMMUNITY)
Admission: AD | Disposition: A | Discharge status: Home / Self Care | Payer: Self-pay | Source: Ambulatory Visit | Attending: Internal Medicine

## 2015-01-31 ENCOUNTER — Encounter (HOSPITAL_COMMUNITY): Payer: Self-pay | Admitting: Anesthesiology

## 2015-01-31 ENCOUNTER — Observation Stay (HOSPITAL_COMMUNITY): Payer: Medicare Other | Admitting: Anesthesiology

## 2015-01-31 DIAGNOSIS — I509 Heart failure, unspecified: Secondary | ICD-10-CM | POA: Diagnosis not present

## 2015-01-31 DIAGNOSIS — M545 Low back pain: Secondary | ICD-10-CM | POA: Diagnosis not present

## 2015-01-31 DIAGNOSIS — E1122 Type 2 diabetes mellitus with diabetic chronic kidney disease: Secondary | ICD-10-CM | POA: Diagnosis not present

## 2015-01-31 DIAGNOSIS — I672 Cerebral atherosclerosis: Secondary | ICD-10-CM | POA: Diagnosis not present

## 2015-01-31 DIAGNOSIS — I5032 Chronic diastolic (congestive) heart failure: Secondary | ICD-10-CM

## 2015-01-31 DIAGNOSIS — H532 Diplopia: Secondary | ICD-10-CM | POA: Diagnosis not present

## 2015-01-31 DIAGNOSIS — I130 Hypertensive heart and chronic kidney disease with heart failure and stage 1 through stage 4 chronic kidney disease, or unspecified chronic kidney disease: Secondary | ICD-10-CM | POA: Diagnosis not present

## 2015-01-31 DIAGNOSIS — R51 Headache: Secondary | ICD-10-CM | POA: Diagnosis not present

## 2015-01-31 DIAGNOSIS — I25110 Atherosclerotic heart disease of native coronary artery with unstable angina pectoris: Secondary | ICD-10-CM

## 2015-01-31 DIAGNOSIS — N183 Chronic kidney disease, stage 3 (moderate): Secondary | ICD-10-CM

## 2015-01-31 DIAGNOSIS — N179 Acute kidney failure, unspecified: Secondary | ICD-10-CM

## 2015-01-31 HISTORY — PX: ARTERY BIOPSY: SHX891

## 2015-01-31 LAB — CBC
HEMATOCRIT: 35.8 % — AB (ref 36.0–46.0)
Hemoglobin: 11 g/dL — ABNORMAL LOW (ref 12.0–15.0)
MCH: 26.1 pg (ref 26.0–34.0)
MCHC: 30.7 g/dL (ref 30.0–36.0)
MCV: 84.8 fL (ref 78.0–100.0)
PLATELETS: 129 10*3/uL — AB (ref 150–400)
RBC: 4.22 MIL/uL (ref 3.87–5.11)
RDW: 17.6 % — AB (ref 11.5–15.5)
WBC: 9.2 10*3/uL (ref 4.0–10.5)

## 2015-01-31 LAB — BASIC METABOLIC PANEL
Anion gap: 10 (ref 5–15)
BUN: 45 mg/dL — AB (ref 6–20)
CALCIUM: 9.5 mg/dL (ref 8.9–10.3)
CHLORIDE: 100 mmol/L — AB (ref 101–111)
CO2: 28 mmol/L (ref 22–32)
CREATININE: 1.76 mg/dL — AB (ref 0.44–1.00)
GFR calc non Af Amer: 28 mL/min — ABNORMAL LOW (ref >60)
GFR, EST AFRICAN AMERICAN: 33 mL/min — AB (ref >60)
Glucose, Bld: 222 mg/dL — ABNORMAL HIGH (ref 65–99)
Potassium: 3.7 mmol/L (ref 3.5–5.1)
SODIUM: 138 mmol/L (ref 135–145)

## 2015-01-31 LAB — GLUCOSE, CAPILLARY
GLUCOSE-CAPILLARY: 213 mg/dL — AB (ref 65–99)
GLUCOSE-CAPILLARY: 149 mg/dL — AB (ref 65–99)
GLUCOSE-CAPILLARY: 251 mg/dL — AB (ref 65–99)
Glucose-Capillary: 149 mg/dL — ABNORMAL HIGH (ref 65–99)
Glucose-Capillary: 150 mg/dL — ABNORMAL HIGH (ref 65–99)
Glucose-Capillary: 188 mg/dL — ABNORMAL HIGH (ref 65–99)
Glucose-Capillary: 210 mg/dL — ABNORMAL HIGH (ref 65–99)

## 2015-01-31 LAB — VITAMIN B12: Vitamin B-12: 324 pg/mL (ref 180–914)

## 2015-01-31 SURGERY — BIOPSY TEMPORAL ARTERY
Anesthesia: Monitor Anesthesia Care | Site: Head | Laterality: Right

## 2015-01-31 MED ORDER — SODIUM CHLORIDE 0.9 % IV SOLN
INTRAVENOUS | Status: AC
  Administered 2015-01-31: 20:00:00 via INTRAVENOUS
  Filled 2015-01-31: qty 1000

## 2015-01-31 MED ORDER — PROPOFOL 500 MG/50ML IV EMUL
INTRAVENOUS | Status: DC | PRN
  Administered 2015-01-31: 50 ug/kg/min via INTRAVENOUS

## 2015-01-31 MED ORDER — INSULIN ASPART 100 UNIT/ML ~~LOC~~ SOLN
0.0000 [IU] | Freq: Three times a day (TID) | SUBCUTANEOUS | Status: DC
  Administered 2015-01-31: 3 [IU] via SUBCUTANEOUS
  Administered 2015-02-01 (×2): 11 [IU] via SUBCUTANEOUS

## 2015-01-31 MED ORDER — LIDOCAINE HCL (CARDIAC) 20 MG/ML IV SOLN
INTRAVENOUS | Status: AC
  Filled 2015-01-31: qty 5

## 2015-01-31 MED ORDER — OXYCODONE-ACETAMINOPHEN 5-325 MG PO TABS: 1.0000 | ORAL_TABLET | ORAL | Status: DC | PRN

## 2015-01-31 MED ORDER — FENTANYL CITRATE (PF) 100 MCG/2ML IJ SOLN
INTRAMUSCULAR | Status: DC | PRN
  Administered 2015-01-31 (×2): 50 ug via INTRAVENOUS

## 2015-01-31 MED ORDER — ONDANSETRON HCL 4 MG/2ML IJ SOLN
INTRAMUSCULAR | Status: AC
  Filled 2015-01-31: qty 2

## 2015-01-31 MED ORDER — LACTATED RINGERS IV SOLN
INTRAVENOUS | Status: DC
  Administered 2015-01-31 (×2): via INTRAVENOUS

## 2015-01-31 MED ORDER — INSULIN ASPART 100 UNIT/ML ~~LOC~~ SOLN
0.0000 [IU] | Freq: Every day | SUBCUTANEOUS | Status: DC
  Administered 2015-01-31: 3 [IU] via SUBCUTANEOUS

## 2015-01-31 MED ORDER — LIDOCAINE HCL (CARDIAC) 20 MG/ML IV SOLN
INTRAVENOUS | Status: DC | PRN
  Administered 2015-01-31: 50 mg via INTRAVENOUS

## 2015-01-31 MED ORDER — FENTANYL CITRATE (PF) 250 MCG/5ML IJ SOLN
INTRAMUSCULAR | Status: AC
  Filled 2015-01-31: qty 5

## 2015-01-31 MED ORDER — MIDAZOLAM HCL 2 MG/2ML IJ SOLN
INTRAMUSCULAR | Status: AC
  Filled 2015-01-31: qty 4

## 2015-01-31 MED ORDER — PROPOFOL 10 MG/ML IV BOLUS
INTRAVENOUS | Status: AC
  Filled 2015-01-31: qty 20

## 2015-01-31 MED ORDER — LIDOCAINE HCL (PF) 1 % IJ SOLN
INTRAMUSCULAR | Status: AC
  Filled 2015-01-31: qty 30

## 2015-01-31 MED ORDER — LIDOCAINE HCL (PF) 1 % IJ SOLN
INTRAMUSCULAR | Status: DC | PRN
  Administered 2015-01-31: 2 mL

## 2015-01-31 MED ORDER — 0.9 % SODIUM CHLORIDE (POUR BTL) OPTIME
TOPICAL | Status: DC | PRN
  Administered 2015-01-31: 1000 mL

## 2015-01-31 SURGICAL SUPPLY — 36 items
BALL CTTN LRG ABS STRL LF (GAUZE/BANDAGES/DRESSINGS) ×1
CANISTER SUCTION 2500CC (MISCELLANEOUS) ×3 IMPLANT
CLIP TI WIDE RED SMALL 6 (CLIP) ×2 IMPLANT
CONT SPEC 4OZ CLIKSEAL STRL BL (MISCELLANEOUS) ×3 IMPLANT
COTTONBALL LRG STERILE PKG (GAUZE/BANDAGES/DRESSINGS) ×3 IMPLANT
DECANTER SPIKE VIAL GLASS SM (MISCELLANEOUS) ×3 IMPLANT
DRAPE OPHTHALMIC 77X100 STRL (CUSTOM PROCEDURE TRAY) ×3 IMPLANT
ELECT NDL TIP 2.8 STRL (NEEDLE) ×1 IMPLANT
ELECT NEEDLE TIP 2.8 STRL (NEEDLE) ×3 IMPLANT
ELECT REM PT RETURN 9FT ADLT (ELECTROSURGICAL) ×3
ELECTRODE REM PT RTRN 9FT ADLT (ELECTROSURGICAL) ×1 IMPLANT
GAUZE SPONGE 2X2 8PLY STRL LF (GAUZE/BANDAGES/DRESSINGS) ×1 IMPLANT
GAUZE SPONGE 4X4 16PLY XRAY LF (GAUZE/BANDAGES/DRESSINGS) ×2 IMPLANT
GEL ULTRASOUND 20GR AQUASONIC (MISCELLANEOUS) ×3 IMPLANT
GLOVE BIO SURGEON STRL SZ7.5 (GLOVE) ×3 IMPLANT
GLOVE BIOGEL PI IND STRL 8 (GLOVE) ×1 IMPLANT
GLOVE BIOGEL PI INDICATOR 8 (GLOVE) ×2
GOWN STRL REUS W/ TWL LRG LVL3 (GOWN DISPOSABLE) ×3 IMPLANT
GOWN STRL REUS W/TWL LRG LVL3 (GOWN DISPOSABLE) ×9
KIT BASIN OR (CUSTOM PROCEDURE TRAY) ×3 IMPLANT
KIT ROOM TURNOVER OR (KITS) ×3 IMPLANT
LIQUID BAND (GAUZE/BANDAGES/DRESSINGS) ×3 IMPLANT
NDL HYPO 25GX1X1/2 BEV (NEEDLE) ×1 IMPLANT
NEEDLE HYPO 25GX1X1/2 BEV (NEEDLE) ×3 IMPLANT
NS IRRIG 1000ML POUR BTL (IV SOLUTION) ×3 IMPLANT
PACK GENERAL/GYN (CUSTOM PROCEDURE TRAY) ×3 IMPLANT
PAD ARMBOARD 7.5X6 YLW CONV (MISCELLANEOUS) ×6 IMPLANT
SPONGE GAUZE 2X2 STER 10/PKG (GAUZE/BANDAGES/DRESSINGS) ×2
SUT PROLENE 6 0 BV (SUTURE) IMPLANT
SUT SILK 3 0 (SUTURE) ×3
SUT SILK 3-0 18XBRD TIE 12 (SUTURE) ×1 IMPLANT
SUT VIC AB 3-0 SH 27 (SUTURE) ×3
SUT VIC AB 3-0 SH 27X BRD (SUTURE) ×1 IMPLANT
SUT VICRYL 4-0 PS2 18IN ABS (SUTURE) ×3 IMPLANT
SYR CONTROL 10ML LL (SYRINGE) ×3 IMPLANT
WATER STERILE IRR 1000ML POUR (IV SOLUTION) ×3 IMPLANT

## 2015-01-31 NOTE — Interval H&P Note (Signed)
History and Physical Interval Note:  01/31/2015 1:21 PM  Brenda [name]  has presented today for surgery, with the diagnosis of Diplopia, right sided headache   The various methods of treatment have been discussed with the patient and family. After consideration of risks, benefits and other options for treatment, the patient has consented to  Procedure(s): BIOPSY TEMPORAL ARTERY (Right) as a surgical intervention .  The patient's history has been reviewed, patient examined, no change in status, stable for surgery.  I have reviewed the patient's chart and labs.  Questions were answered to the patient's satisfaction.     [name], Daneil Beem

## 2015-01-31 NOTE — H&P (View-Only) (Signed)
Vascular and Vein [facility] Consult  Reason for Consult:  Biopsy for possible temporal arteritis Referring Physician:  Dr. Arbutus Leas ([facility]) MRN #:  [id number]  History of Present Illness: This is a 69 y.o. female who we've been consulted for right temporal artery biopsy. The patient reports a history of three month right eye pain that has worsened in the past two weeks. She has a history of glaucoma and left eye cataract surgery (August 2016). The patient has a history of chronic headaches, but has complained of worsening right sided headaches around the right parietal area starting last week. She has also complained of right sided diplopia for the past four days. She saw her ophthalmologist, Dr. Alvino Chapel on 01/27/15 who sent her to the ED for evaluation due to concerns for temporal arteritis. Her sed rate was 35 (ref: 0-22) and CRP 1.3 (ref <1.0). Neurology was consulted and an MRI was performed revealing no acute findings and she was discharged home. She was contacted by her ophthalmologist the following day due to continued concerns for temporal arteritis and the patient was directly admitted.   The patient denies any temporary blindness, fevers, abnormal fatigue (fatigue at baseline), weight loss, jaw claudication or scalp tenderness.   She has a past medical history of CAD s/p CABG 2005 and multiple stent placements, CHF, insulin dependent diabetes, hypertension and CKD stage III. She has no prior history of CVA.   Past Medical History  Diagnosis Date  . Angina at rest [facility])   . Coronary artery disease   . Hypertension   . CHF (congestive heart failure) (HCC)   . Cirrhosis (HCC)   . CAD (coronary artery disease) 02/20/2011  . Ulcer of lower limb, unspecified 05/12/2009  . NAFLD (nonalcoholic fatty liver disease) 40/11/8117  . Fibromyalgia   . Glaucoma, both eyes   . A-fib (HCC)   . Thrombocytopenia (HCC) 02/21/2011  . Gout   . Hyperlipemia   . DVT (deep venous thrombosis)  (HCC) ~ 2002    RLE  . Sleep apnea   . Type II diabetes mellitus (HCC)   . History of blood transfusion 2005    "related to bypass OR"  . GERD (gastroesophageal reflux disease)   . Jaundice ~ 1952    "yellow jaundice"  . Headache     "~ q week" (01/30/2015)  . Arthritis     "back, knees, feet" (01/30/2015)  . Chronic low back pain     "into hips"  . Renal disorder   . CHRONIC KIDNEY DISEASE STAGE III (MODERATE) 05/12/2009  . Diabetic peripheral neuropathy [facility])    Past Surgical History  Procedure Laterality Date  . Coronary angioplasty with stent placement  "lots"  . Coronary artery bypass graft  2005    'CABG X4"  . Cesarean section  1974  . Bunionectomy with hammertoe reconstruction Bilateral   . Toe amputation Left 2008-2011    1st and 2nd digits  . Dilation and curettage of uterus  "several"  . Abdominal hysterectomy  1980  . Neck lesion biopsy Left 1980's  . Ganglion cyst excision Right ~ 1965  . Left heart catheterization with coronary/graft angiogram N/A 02/22/2011    Procedure: LEFT HEART CATHETERIZATION WITH Isabel Caprice;  Surgeon: Chrystie Nose, MD;  Location: [facility] CATH LAB;  Service: Cardiovascular;  Laterality: N/A;  . Tonsillectomy and adenoidectomy  ~ 1961  . Appendectomy  ~ 1971  . Cholecystectomy open  ~ 1971  . Excisional hemorrhoidectomy  ~ 1966  .  Pilonidal cyst excision  ~ 1966  . Anal fissure repair  ~ 1966    "w/fistula"  . Cataract extraction w/ intraocular lens implant Left 11/06/2014  . Wisdom tooth extraction  ~ 1972    "all at one time"    Allergies  Allergen Reactions  . Other Other (See Comments)    STEROIDS-CAUSE EXTREME HYPERGLYCEMIA >500MG /DL  . Ciprofloxacin Palpitations    REACTION: Felt heart palps and "weird"  . Codeine Nausea And Vomiting  . Penicillins Other (See Comments)    REACTION: rash Has patient had a PCN reaction causing immediate rash, facial/tongue/throat swelling, SOB or lightheadedness with hypotension:  {Yes Has patient had a PCN reaction causing severe rash involving mucus membranes or skin necrosis: NO Has patient had a PCN reaction that required hospitalization NO Has patient had a PCN reaction occurring within the last 10 years: NO If all of the above answers are "NO", then may proceed with Cephalosporin use.   . Simvastatin Hives  . Sulfonamide Derivatives Nausea Only  . Aspirin Other (See Comments)    REACTION: use only enteric coated  . Fluvastatin Sodium Other (See Comments)    Prior to Admission medications   Medication Sig Start Date End Date Taking? Authorizing Provider  acetaminophen (TYLENOL) 500 MG tablet Take 1,000 mg by mouth every 8 (eight) hours as needed for moderate pain. For pain.   Yes Historical Provider, MD  amLODipine (NORVASC) 5 MG tablet Take 5 mg by mouth daily.   Yes Historical Provider, MD  aspirin EC 81 MG tablet Take 81 mg by mouth every evening.    Yes Historical Provider, MD  betamethasone valerate (VALISONE) 0.1 % cream Apply 1 application topically daily as needed. For rash   Yes Historical Provider, MD  bimatoprost (LUMIGAN) 0.01 % SOLN Place 1 drop into both eyes at bedtime.     Yes Historical Provider, MD  brimonidine (ALPHAGAN P) 0.1 % SOLN Place 1 drop into both eyes 3 (three) times daily.     Yes Historical Provider, MD  bumetanide (BUMEX) 2 MG tablet Take 2 mg by mouth daily.     Yes Historical Provider, MD  Choline Fenofibrate (TRILIPIX) 45 MG capsule Take 45 mg by mouth every evening.    Yes Historical Provider, MD  cloNIDine (CATAPRES - DOSED IN MG/24 HR) 0.2 mg/24hr patch Place 0.2 mg onto the skin once a week. Every Saturday   Yes Historical Provider, MD  clopidogrel (PLAVIX) 75 MG tablet Take 75 mg by mouth daily.   Yes Historical Provider, MD  docusate sodium (COLACE) 100 MG capsule Take 100 mg by mouth daily as needed for mild constipation.    Yes Historical Provider, MD  esomeprazole (NEXIUM) 40 MG capsule Take 40 mg by mouth daily before  breakfast.     Yes Historical Provider, MD  exenatide (BYETTA 5 MCG PEN) 5 MCG/0.02ML SOLN Inject 5 mcg into the skin 3 (three) times daily as needed (ONLY USES AS NEEDED FOR HYPERGLYCEMIC EVENTS).    Yes Historical Provider, MD  febuxostat (ULORIC) 40 MG tablet Take 40 mg by mouth daily.   Yes Historical Provider, MD  gabapentin (NEURONTIN) 300 MG capsule Take 300 mg by mouth at bedtime.   Yes Historical Provider, MD  Icosapent Ethyl (VASCEPA) 1 G CAPS Take 1 capsule by mouth 3 (three) times daily.   Yes Historical Provider, MD  insulin detemir (LEVEMIR) 100 UNIT/ML injection Inject 100 Units into the skin 2 (two) times daily.     Yes  Historical Provider, MD  insulin lispro (HUMALOG) 100 UNIT/ML injection Inject 20-40 Units into the skin 3 (three) times daily as needed for high blood sugar. Sliding scale.   Yes Historical Provider, MD  isosorbide dinitrate (ISORDIL) 20 MG tablet Take 20 mg by mouth 2 (two) times daily.   Yes Historical Provider, MD  loratadine (CLARITIN) 10 MG tablet Take 10 mg by mouth every evening.    Yes Historical Provider, MD  meclizine (ANTIVERT) 25 MG tablet Take 25 mg by mouth daily as needed for dizziness.    Yes Historical Provider, MD  mometasone (NASONEX) 50 MCG/ACT nasal spray Place 2 sprays into the nose daily.     Yes Historical Provider, MD  nitroGLYCERIN (NITROSTAT) 0.4 MG SL tablet Place 0.4 mg under the tongue every 5 (five) minutes as needed. For pain.    Yes Historical Provider, MD  rosuvastatin (CRESTOR) 20 MG tablet Take 20 mg by mouth every evening.    Yes Historical Provider, MD  simethicone (MYLICON) 125 MG chewable tablet Chew 125 mg by mouth at bedtime.    Yes Historical Provider, MD  sotalol (BETAPACE) 80 MG tablet Take 80 mg by mouth 2 (two) times daily.     Yes Historical Provider, MD  spironolactone (ALDACTONE) 25 MG tablet Take 25 mg by mouth 2 (two) times daily.   Yes Historical Provider, MD  Timolol Maleate (ISTALOL) 0.5 % (DAILY) SOLN Apply 1  drop to eye every morning.   Yes Historical Provider, MD    Social History   Social History  . Marital Status: Married    Spouse Name: N/A  . Number of Children: N/A  . Years of Education: N/A   Occupational History  . Not on file.   Social History Main Topics  . Smoking status: Never Smoker   . Smokeless tobacco: Never Used  . Alcohol Use: No  . Drug Use: No  . Sexual Activity: Not Currently   Other Topics Concern  . Not on file   Social History Narrative    Family History  Problem Relation Age of Onset  . Stroke Mother   . Dementia Mother   . Hepatitis Mother   . Arrhythmia Mother   . Heart disease Mother   . Hypertension Mother   . Diabetes Mother   . Hyperlipidemia Mother   . Alzheimer's disease Father   . Pneumonia Father   . Heart disease Father   . Hyperlipidemia Father   . Cancer Brother   . Dementia Brother   . Heart failure Maternal Grandmother   . Heart failure Maternal Grandfather   . Heart failure Paternal Grandmother   . Heart failure Paternal Grandfather     ROS: [x]  Positive   [ ]  Negative   [ ]  All sytems reviewed and are negative  Cardiovascular: []  chest pain/pressure []  palpitations [x]  SOB lying flat [x]  DOE []  pain in legs while walking []  pain in legs at rest []  pain in legs at night []  non-healing ulcers []  hx of DVT []  swelling in legs  Pulmonary: []  productive cough []  asthma/wheezing []  home O2  Neurologic: []  weakness in []  arms []  legs []  numbness in []  arms []  legs []  hx of CVA []  mini stroke [] difficulty speaking or slurred speech []  temporary loss of vision in one eye []  dizziness  Hematologic: []  hx of cancer []  bleeding problems []  problems with blood clotting easily  Endocrine:   [x]  diabetes []  thyroid disease  GI []  vomiting blood []   blood in stool  GU:  CKD/renal failure  HD--[]  M/W/F or  T/T/S  burning with urination  blood in urine  Psychiatric:  anxiety   depression  Musculoskeletal:  arthritis  joint pain  Integumentary:  rashes  ulcers  Constitutional:  fever  chills   Physical Examination  Filed Vitals:   01/30/15 1020  BP: 131/55  Pulse: 63  Temp:   Resp:    Body mass index is 37.3 kg/(m^2).  General:  WDWN obese female in NAD Gait: Not observed HENT: WNL, normocephalic Eyes: EOMI, reports right sided diploplia Pulmonary: normal non-labored breathing, clear to auscultation bilaterally  Cardiac: regular, without  Murmurs, rubs or gallops Abdomen: soft, obese, NT/ND, no masses Vascular Exam/Pulses: 2+ radial pulses symmetric, palpable carotids with no bruits detected, palpable dorsalis pedis pulses bilaterally Extremities: no ischemic changes,  left 1st and 2nd toes amputated.  Musculoskeletal: no muscle wasting or atrophy  Neurologic: A&O X 3; Appropriate Affect ; SENSATION: normal; MOTOR FUNCTION:  moving all extremities equally. Speech is fluent/normal   CBC    Component Value Date/Time   WBC 6.6 01/29/2015 2206   RBC 4.49 01/29/2015 2206   RBC 3.80* 02/22/2011 0720   HGB 11.7* 01/29/2015 2206   HCT 37.6 01/29/2015 2206   PLT 127* 01/29/2015 2206   MCV 83.7 01/29/2015 2206   MCH 26.1 01/29/2015 2206   MCHC 31.1 01/29/2015 2206   RDW 17.3* 01/29/2015 2206   LYMPHSABS 1.3 01/28/2015 1602   MONOABS 0.6 01/28/2015 1602   EOSABS 0.3 01/28/2015 1602   BASOSABS 0.0 01/28/2015 1602    BMET    Component Value Date/Time   NA 134* 01/30/2015 0540   K 4.4 01/30/2015 0540   CL 100* 01/30/2015 0540   CO2 25 01/30/2015 0540   GLUCOSE 256* 01/30/2015 0540   BUN 36* 01/30/2015 0540   CREATININE 1.48* 01/30/2015 0540   CALCIUM 9.3 01/30/2015 0540   GFRNONAA 35* 01/30/2015 0540   GFRAA 41* 01/30/2015 0540    COAGS: Lab Results  Component Value Date   INR 1.12 02/20/2011   INR 0.99 10/06/2009   INR 1.09 10/04/2009    Statin:  Yes.   Beta Blocker:  Yes.   Aspirin:  Yes.   ACEI:  No. ARB:   No. Other antiplatelets/anticoagulants:  Yes.   Plavix.    ASSESSMENT: This is a 69 y.o. female with right eye diplopia and right sided headache concerning for possible temporal arteritis.   PLAN: -Prednisone has been started.  -Plan for temporal artery biopsy tomorrow.   -NPO past midnight. Obtain consent.     Maris Berger, PA-C [facility] Office: [phone] Pager: [phone]  Agree with above. Plan right temporal artery biopsy tomorrow. Discussed with patient. All questions answered.  Waverly Ferrari, MD, FACS Beeper [phone] Office: [phone]

## 2015-01-31 NOTE — Op Note (Signed)
    NAME: Brenda [name]   MRN: [id number] DOB: May 01, 1945    DATE OF OPERATION: 01/31/2015  PREOP DIAGNOSIS: rule out temporal arteritis  POSTOP DIAGNOSIS: same  PROCEDURE: right temporal artery biopsy  SURGEON: Di Kindlehristopher S. Edilia Boickson, MD, FACS  ASSIST: none  ANESTHESIA: local with sedation   EBL: minimal  INDICATIONS: Brenda [name] is a 69 y.o. female he was being worked up for temporal arteritis. I was asked to perform a temporal artery biopsy on the right.  FINDINGS: Normal appearing right temporal artery  TECHNIQUE: The patient was taken to the operating room and sedated by anesthesia. The temporal area on the right was prepped and draped in usual sterile fashion. The location of the temporal artery was then applied with the ultrasound. After the skin was anesthetized with 1% lidocaine, an incision was made over the temporal artery and the dissection carried down to the artery which was dissected free. It was mobilized over a distance of a proximally 3 cm. It was ligated proximally and distally with a 3-0 silk tie and then removed the specimen sent to pathology. Hemostasis was obtained in the wound. The wound was closed with a clear 3-0 Vicryl and the skin closed with 4-0 Vicryl. Liquid band was applied. The patient tolerated the procedure well and transferred to the recovery room in stable condition. All needle and sponge counts were correct.  Brenda Ferrarihristopher Cayley Pester, MD, FACS [facility]  DATE OF DICTATION:   01/31/2015

## 2015-01-31 NOTE — Progress Notes (Signed)
PROGRESS NOTE  Brenda [name] WGN:[id number] DOB: July 03, 1945 DOA: 01/29/2015 PCP: Michiel SitesKOHUT,WALTER DENNIS, MD  Brief History 69 year old female with a history of coronary artery disease with CABG April 2005, cataracts, hypertension, diabetes mellitus, CKD stage III, CHF, former tobacco use quit 1999 presented with worsening right eye pain, right temporal parietal headache, and diplopia. Apparently, the patient has had a history of cataracts having the left eye repaired in August 2016. She relates blurry vision in her right eye since August 2016, but stated that her symptoms worsened beginning 01/24/2015. Once again, the patient has also been complaining of chronic headaches, temporal parietal area, right and left but states that her symptoms had worsened since 01/24/2015. She went to see her ophthalmologist, Dr. Alvino Chapelhoi, on 01/27/2015. Apparently there was some concern for temporal arteritis. The patient was sent to the emergency department. In the emergency department on 01/28/2015, sedimentation rate was 35 with CRP 1.2. Neurology was consulted in the emergency department where an MRI of the brain was obtained. There was concern for brainstem infarct as contributing to her symptoms. MRI of the brain was negative for any acute findings, and the patient was sent home from the emergency department. The patient was once again contacted by her ophthalmologist on the following day with continued concern for temporal arteritis. As a result, the hospitalist service was contacted for direct admission.  Assessment/Plan:  Diplopia/headache/blurry vision  -The symptoms have been present for the last 3-4 months, with subjective worsening since 01/24/2015  -CRP and sedimentation rate are minimally elevated for the patient's age  -Based upon clinical and laboratory evidence, suspicion remains fairly low for temporal arteritis  -Nevertheless, consulted vascular surgery for temporal artery biopsy   -Continue prednisone for now  -If there is no symptom resolution or improvement within one week, this would further argue against temporal arteritis  -Appreciate vascular surgery consult -01/31/2015 right temporal artery biopsy -Patient will continue on prednisone pending pathology -She will follow-up with her ophthalmologist--Dr. [name] Diabetes mellitus type 2  -Anticipate worsening glycemic control secondary to prednisone  -Adjust Levemir and NovoLog as needed  -continue Byetta -01/28/2015 hemoglobin A1c 6.7  Thrombocytopenia  -Has been chronic dating back to 05/07/2009  -check B12--324 Coronary artery disease  -Status post CABG April 2005  -Patient relates history of stents over 69 years old  -Hold Plavix in preparation for biopsy--plan to restart plavix 11/12 -Continue aspirin  -02/22/2011 heart catheterization--3 vessel native disease with patent grafts--it was thought the patient's chest pain was secondary to her fibromyalgia  Chronic diastolic CHF  -Euvolemic presently  -Continue home dose Bumex  -Continue spironolactone -Daily weights  Atrial fibrillation  -Presently in sinus rhythm  -Continue Betapace  Hypertension  -Continue clonidine and amlodipine  CKD stage III  -Baseline creatinine 1.1-1.4  -give fluid challenge due to increased serum creatinine up to 1.76 -am BMP Hyperlipidemia  -Continue statin   Family Communication: Pt at beside Disposition Plan: Home 11/12 if stable    Procedures/Studies: [name] Contrast  01/28/2015  CLINICAL DATA:  Blurred vision.  Diplopia. EXAM: MRI HEAD WITHOUT CONTRAST TECHNIQUE: Multiplanar, multiecho pulse sequences of the brain and surrounding structures were obtained without intravenous contrast. COMPARISON:  None. FINDINGS: Ventricle size normal. Cerebral volume normal. Pituitary not enlarged. No compression of the optic chiasm. Negative for acute infarct. Small hyperintensities in the  right parietal white matter consistent with mild chronic microvascular ischemia. Brainstem and basal ganglia normal. Negative for intracranial hemorrhage.  Negative for mass or edema Paranasal sinuses clear.  Normal orbit. IMPRESSION: No acute intracranial abnormality. Mild chronic microvascular ischemic change in the white matter. Electronically Signed   By: Marlan Palau M.D.   On: 01/28/2015 20:13         Subjective: Patient states that her headache is somewhat better than yesterday but still having blurred vision or diplopia. Denies any fevers, chills, chest pain, short of breath, nausea, vomiting, diarrhea, abdominal pain.  Objective: Filed Vitals:   01/31/15 1500 01/31/15 1513 01/31/15 1515 01/31/15 1526  BP:  152/55    Pulse: 61 62 62   Temp:    97.7 F (36.5 C)  TempSrc:      Resp: Weight:      SpO2: 98% 98% 97%     Intake/Output Summary (Last 24 hours) at 01/31/15 1823 Last data filed at 01/31/15 1548  Gross per 24 hour  Intake    950 ml  Output   1325 ml  Net   -375 ml   Weight change: 0.68 kg (1 lb 8 oz) Exam:   General:  Pt is alert, follows commands appropriately, not in acute distress  HEENT: No icterus, No thrush, No neck mass, [place]/AT  Cardiovascular: RRR, S1/S2, no rubs, no gallops; right temporal artery surgical site without any drainage or blood  Respiratory: CTA bilaterally, no wheezing, no crackles, no rhonchi  Abdomen: Soft/+BS, non tender, non distended, no guarding  Extremities: 1+LE edema, No lymphangitis, No petechiae, No rashes, no synovitis  Data Reviewed: Basic Metabolic Panel:  Recent Labs Lab 01/28/15 1602 01/29/15 2206 01/30/15 0540 01/31/15 0449  NA 140 137 134* 138  K 4.1 3.6 4.4 3.7  CL 101 101 100* 100*  CO2 GLUCOSE 119* 83 256* 222*  BUN 34* 34* 36* 45*  CREATININE 1.48* 1.39* 1.48* 1.76*  CALCIUM 9.7 9.5 9.3 9.5   Liver Function Tests:  Recent Labs Lab 01/29/15 2206  AST 34  ALT 21   ALKPHOS 77  BILITOT 0.5  PROT 7.1  ALBUMIN 3.4*   No results for input(s): LIPASE, AMYLASE in the last 168 hours. No results for input(s): AMMONIA in the last 168 hours. CBC:  Recent Labs Lab 01/28/15 1602 01/29/15 2206 01/31/15 0449  WBC 5.8 6.6 9.2  NEUTROABS 3.6  --   --   HGB 12.1 11.7* 11.0*  HCT 39.2 37.6 35.8*  MCV 84.1 83.7 84.8  PLT 121* 127* 129*   Cardiac Enzymes: No results for input(s): CKTOTAL, CKMB, CKMBINDEX, TROPONINI in the last 168 hours. BNP: Invalid input(s): POCBNP CBG:  Recent Labs Lab 01/31/15 0428 01/31/15 0806 01/31/15 1152 01/31/15 1524 01/31/15 1723  GLUCAP 213* 188* 149* 149* 150*    No results found for this or any previous visit (from the past 240 hour(s)).   Scheduled Meds: . amLODipine  5 mg Oral Daily  . aspirin EC  81 mg Oral Daily  . brimonidine  1 drop Both Eyes TID  . bumetanide  2 mg Oral Daily  . cloNIDine  0.2 mg Transdermal Weekly  . exenatide  5 mcg Subcutaneous TID WC  . febuxostat  80 mg Oral Daily  . fenofibrate  54 mg Oral Daily  . fluticasone  1 spray Each Nare Daily  . gabapentin  300 mg Oral QHS  . insulin aspart  0-20 Units Subcutaneous TID WC  . insulin aspart  0-5 Units Subcutaneous QHS  . insulin detemir  50 Units  Subcutaneous BID  . isosorbide dinitrate  20 mg Oral BID  . latanoprost  1 drop Both Eyes QHS  . loratadine  10 mg Oral Daily  . omega-3 acid ethyl esters  1 g Oral TID  . pantoprazole  40 mg Oral Daily  . predniSONE  50 mg Oral Q24H  . rosuvastatin  20 mg Oral Daily  . sotalol  80 mg Oral BID  . spironolactone  25 mg Oral BID  . timolol  1 drop Both Eyes Daily  . triamcinolone cream   Topical Daily   Continuous Infusions: . lactated ringers 10 mL/hr at 01/31/15 1238     Tehilla Coffel, DO  [facility] Pager [phone]  If 7PM-7AM, please contact night-coverage [email or web address] Password [email or web address] 01/31/2015, 6:23 PM   LOS: 2 days

## 2015-02-01 LAB — BASIC METABOLIC PANEL
ANION GAP: 10 (ref 5–15)
Anion gap: 10 (ref 5–15)
BUN: 52 mg/dL — AB (ref 6–20)
BUN: 54 mg/dL — ABNORMAL HIGH (ref 6–20)
CALCIUM: 9.3 mg/dL (ref 8.9–10.3)
CHLORIDE: 102 mmol/L (ref 101–111)
CO2: 24 mmol/L (ref 22–32)
CO2: 24 mmol/L (ref 22–32)
CREATININE: 1.81 mg/dL — AB (ref 0.44–1.00)
Calcium: 9.2 mg/dL (ref 8.9–10.3)
Chloride: 102 mmol/L (ref 101–111)
Creatinine, Ser: 1.78 mg/dL — ABNORMAL HIGH (ref 0.44–1.00)
GFR calc Af Amer: 32 mL/min — ABNORMAL LOW (ref >60)
GFR calc non Af Amer: 28 mL/min — ABNORMAL LOW (ref >60)
GFR, EST AFRICAN AMERICAN: 32 mL/min — AB (ref >60)
GFR, EST NON AFRICAN AMERICAN: 27 mL/min — AB (ref >60)
Glucose, Bld: 262 mg/dL — ABNORMAL HIGH (ref 65–99)
Glucose, Bld: 353 mg/dL — ABNORMAL HIGH (ref 65–99)
POTASSIUM: 5.7 mmol/L — AB (ref 3.5–5.1)
Potassium: 5.1 mmol/L (ref 3.5–5.1)
SODIUM: 136 mmol/L (ref 135–145)
SODIUM: 136 mmol/L (ref 135–145)

## 2015-02-01 LAB — GLUCOSE, CAPILLARY
GLUCOSE-CAPILLARY: 248 mg/dL — AB (ref 65–99)
Glucose-Capillary: 256 mg/dL — ABNORMAL HIGH (ref 65–99)

## 2015-02-01 LAB — MAGNESIUM: MAGNESIUM: 2.1 mg/dL (ref 1.7–2.4)

## 2015-02-01 MED ORDER — PREDNISONE 50 MG PO TABS: 50.0000 mg | ORAL_TABLET | Freq: Every day | ORAL | Status: DC

## 2015-02-01 NOTE — Progress Notes (Signed)
02/01/15 Patient to be discharged home with spouse, IV site removed, and discharge instructions reviewed with patient.

## 2015-02-01 NOTE — Progress Notes (Signed)
   Daily Progress Note  R temporal incision c/d/i.  Pathology result not back yet  - follow up with Dr. Edilia Boickson in 2 weeks.  Leonides SakeBrian Selene Peltzer, MD [facility] Office: [phone] Pager: [phone]  02/01/2015, 8:27 AM

## 2015-02-01 NOTE — Progress Notes (Signed)
Pt c/o watery drainage and newly onset blurred vision on her lt eye. Pt reports her eye stings too. VSS. Warm wash cloth applied to left eye for comfort. On-call provider Benedetto Coons Callahan, NP notified via text-page. No new orders received at this time. Will cont to monitor closely.

## 2015-02-01 NOTE — Discharge Summary (Addendum)
Physician Discharge Summary  Brenda CommonRebecca K Broerman ZOX:[id number] DOB: 1945-07-27 DOA: 01/29/2015  PCP: Michiel SitesKOHUT,WALTER DENNIS, MD  Admit date: 01/29/2015 Discharge date: 02/01/2015  Recommendations for Outpatient Follow-up:  1. Pt will need to follow up with PCP in 2 weeks post discharge 2. Please obtain BMP on  02/03/15 or 02/04/15 3. Please follow up with temporal artery biopsy results  Discharge Diagnoses:  Diplopia/headache/blurry vision  -The symptoms have been present for the last 3-4 months, with subjective worsening since 01/24/2015  -CRP and sedimentation rate are minimally elevated for the patient's age  -Based upon clinical and laboratory evidence, suspicion remains fairly low for temporal arteritis  -Nevertheless, consulted vascular surgery for temporal artery biopsy  -Continue prednisone for now  -If there is no symptom resolution or improvement within one week, this would further argue against temporal arteritis  -Appreciate vascular surgery consult -01/31/2015 right temporal artery biopsy -Patient will continue on prednisone pending pathology -She will follow-up with her ophthalmologist--Dr. [name] -Patient was instructed to continue prednisone but she follows up with her ophthalmologist- she has an appointment on 02/04/2015 at 4 PM Diabetes mellitus type 2  -Anticipate worsening glycemic control secondary to prednisone  -Adjust Levemir and NovoLog as needed  -home with prior to admission dose of levemir 100 units bid -The patient will resume her prior Humalog sliding scale prior to admission -I anticipate her CBGs will be more elevated secondary to her steroid use -continue Byetta  -01/28/2015 hemoglobin A1c 6.7  Thrombocytopenia  -Has been chronic dating back to 05/07/2009  -check B12--324 Coronary artery disease  -Status post CABG April 2005  -Patient relates history of stents over 69 years old  -Hold Plavix in preparation for biopsy--plan to  restart plavix 11/12 -Continue aspirin  -02/22/2011 heart catheterization--3 vessel native disease with patent grafts--it was thought the patient's chest pain was secondary to her fibromyalgia  Chronic diastolic CHF  -Euvolemic presently  -As serum creatinine slightly above baseline, I have instructed pt to hold her bumex until 02/03/15 -Daily weights--2 pounds less than admission weight -d/c weight 229 lbs -hold spironolactone until she follows up with PCP due K trending up Atrial fibrillation  -Presently in sinus rhythm  -Continue Betapace  -Resume aspirin and Plavix Hypertension  -Continue clonidine and amlodipine  CKD stage III  -Baseline creatinine 1.1-1.4  -give fluid challenge due to increased serum creatinine up to 1.76 -hold bumex until 02/02/14 -please obtain bmp on 11/14 or 11/15 -hold spironolactone until she follows up with pcp Hyperlipidemia  -Continue statin  Discharge Condition: stable  Disposition: home  Diet:carb modified Wt Readings from Last 3 Encounters:  02/01/15 104.101 kg (229 lb 8 oz)  05/29/14 99.791 kg (220 lb)  02/23/11 93.1 kg (205 lb 4 oz)    History of present illness:  69 year old female with a history of coronary artery disease with CABG April 2005, cataracts, hypertension, diabetes mellitus, CKD stage III, CHF, former tobacco use quit 1999 presented with worsening right eye pain, right temporal parietal headache, and diplopia. Apparently, the patient has had a history of cataracts having the left eye repaired in August 2016. She relates blurry vision in her right eye since August 2016, but stated that her symptoms worsened beginning 01/24/2015. Once again, the patient has also been complaining of chronic headaches, temporal parietal area, right and left but states that her symptoms had worsened since 01/24/2015. She went to see her ophthalmologist, Dr. Alvino Chapelhoi, on 01/27/2015. Apparently there was some concern for temporal arteritis. The  patient was sent  to the emergency department. In the emergency department on 01/28/2015, sedimentation rate was 35 with CRP 1.2. Neurology was consulted in the emergency department where an MRI of the brain was obtained. There was concern for brainstem infarct as contributing to her symptoms. MRI of the brain was negative for any acute findings, and the patient was sent home from the emergency department. The patient was once again contacted by her ophthalmologist on the following day with continued concern for temporal arteritis. As a result, the hospitalist service was contacted for direct admission. Thus, surgery was consulted. The patient was taken to surgery and had a temporal artery biopsy on 01/30/2015. The patient was stable postoperatively and was monitored for 24 hours postoperatively. The patient's serum creatinine increased slightly. . The patient's Bumex was held for 24 hours. The patient was instructed not to restart her Bumex until 02/03/2015. Hold spironolactone until follow up with PCP  Consultants: Vascular surgery  Discharge Exam: Filed Vitals:   02/01/15 0615  BP: 139/54  Pulse: 56  Temp: 97.6 F (36.4 C)  Resp: 16   Filed Vitals:   02/01/15 0159 02/01/15 0356 02/01/15 0408 02/01/15 0615  BP: 130/48 142/64  139/54  Pulse: 58 61  56  Temp: 98.2 F (36.8 C) 97.5 F (36.4 C)  97.6 F (36.4 C)  TempSrc: Oral Oral  Oral  Resp: Weight:   104.101 kg (229 lb 8 oz)   SpO2: 94% 93%  98%   General: A&O x 3, NAD, pleasant, cooperative Cardiovascular: RRR, no rub, no gallop, no S3 Respiratory: CTAB, no wheeze, no rhonchi Abdomen:soft, nontender, nondistended, positive bowel sounds Extremities: No edema, No lymphangitis, no petechiae  Discharge Instructions      Discharge Instructions    Diet - low sodium heart healthy    Complete by:  As directed      Increase activity slowly    Complete by:  As directed             Medication List    STOP taking  these medications        spironolactone 25 MG tablet  Commonly known as:  ALDACTONE      TAKE these medications        acetaminophen 500 MG tablet  Commonly known as:  TYLENOL  Take 1,000 mg by mouth every 8 (eight) hours as needed for moderate pain. For pain.     amLODipine 5 MG tablet  Commonly known as:  NORVASC  Take 5 mg by mouth daily.     aspirin EC 81 MG tablet  Take 81 mg by mouth every evening.     betamethasone valerate 0.1 % cream  Commonly known as:  VALISONE  Apply 1 application topically daily as needed. For rash     bimatoprost 0.01 % Soln  Commonly known as:  LUMIGAN  Place 1 drop into both eyes at bedtime.     brimonidine 0.1 % Soln  Commonly known as:  ALPHAGAN P  Place 1 drop into both eyes 3 (three) times daily.     bumetanide 2 MG tablet  Commonly known as:  BUMEX  Take 2 mg by mouth daily.     BYETTA 5 MCG PEN 5 MCG/0.02ML Sopn injection  Generic drug:  exenatide  Inject 5 mcg into the skin 3 (three) times daily as needed (ONLY USES AS NEEDED FOR HYPERGLYCEMIC EVENTS).     cloNIDine 0.2 mg/24hr patch  Commonly known as:  CATAPRES - Dosed  in mg/24 hr  Place 0.2 mg onto the skin once a week. Every Saturday     clopidogrel 75 MG tablet  Commonly known as:  PLAVIX  Take 75 mg by mouth daily.     docusate sodium 100 MG capsule  Commonly known as:  COLACE  Take 100 mg by mouth daily as needed for mild constipation.     esomeprazole 40 MG capsule  Commonly known as:  NEXIUM  Take 40 mg by mouth daily before breakfast.     febuxostat 40 MG tablet  Commonly known as:  ULORIC  Take 40 mg by mouth daily.     gabapentin 300 MG capsule  Commonly known as:  NEURONTIN  Take 300 mg by mouth at bedtime.     insulin detemir 100 UNIT/ML injection  Commonly known as:  LEVEMIR  Inject 100 Units into the skin 2 (two) times daily.     insulin lispro 100 UNIT/ML injection  Commonly known as:  HUMALOG  Inject 20-40 Units into the skin 3 (three) times  daily as needed for high blood sugar. Sliding scale.     isosorbide dinitrate 20 MG tablet  Commonly known as:  ISORDIL  Take 20 mg by mouth 2 (two) times daily.     ISTALOL 0.5 % (DAILY) Soln  Generic drug:  Timolol Maleate  Apply 1 drop to eye every morning. Both eyes     loratadine 10 MG tablet  Commonly known as:  CLARITIN  Take 10 mg by mouth every evening.     meclizine 25 MG tablet  Commonly known as:  ANTIVERT  Take 25 mg by mouth daily as needed for dizziness.     mometasone 50 MCG/ACT nasal spray  Commonly known as:  NASONEX  Place 2 sprays into the nose daily.     nitroGLYCERIN 0.4 MG SL tablet  Commonly known as:  NITROSTAT  Place 0.4 mg under the tongue every 5 (five) minutes as needed. For pain.     predniSONE 50 MG tablet  Commonly known as:  DELTASONE  Take 1 tablet (50 mg total) by mouth daily with supper.     rosuvastatin 20 MG tablet  Commonly known as:  CRESTOR  Take 20 mg by mouth every evening.     simethicone 125 MG chewable tablet  Commonly known as:  MYLICON  Chew 125 mg by mouth at bedtime.     sotalol 80 MG tablet  Commonly known as:  BETAPACE  Take 80 mg by mouth 2 (two) times daily.     TRILIPIX 45 MG capsule  Generic drug:  Choline Fenofibrate  Take 45 mg by mouth every evening.     VASCEPA 1 G Caps  Generic drug:  Icosapent Ethyl  Take 1 capsule by mouth 3 (three) times daily.         The results of significant diagnostics from this hospitalization (including imaging, microbiology, ancillary and laboratory) are listed below for reference.    Significant Diagnostic Studies: [name] Contrast  01/28/2015  CLINICAL DATA:  Blurred vision.  Diplopia. EXAM: MRI HEAD WITHOUT CONTRAST TECHNIQUE: Multiplanar, multiecho pulse sequences of the brain and surrounding structures were obtained without intravenous contrast. COMPARISON:  None. FINDINGS: Ventricle size normal. Cerebral volume normal. Pituitary not enlarged. No compression of  the optic chiasm. Negative for acute infarct. Small hyperintensities in the right parietal white matter consistent with mild chronic microvascular ischemia. Brainstem and basal ganglia normal. Negative for intracranial hemorrhage.  Negative for mass  or edema Paranasal sinuses clear.  Normal orbit. IMPRESSION: No acute intracranial abnormality. Mild chronic microvascular ischemic change in the white matter. Electronically Signed   By: Marlan Palau M.D.   On: 01/28/2015 20:13     Microbiology: No results found for this or any previous visit (from the past 240 hour(s)).   Labs: Basic Metabolic Panel:  Recent Labs Lab 01/29/15 2206 01/30/15 0540 01/31/15 0449 02/01/15 0620 02/01/15 1009  NA 137 134* 138 136 136  K 3.6 4.4 3.7 5.7* 5.1  CL 101 100* 100* 102 102  CO2 27 25 28 24 24   GLUCOSE 83 256* 222* 262* 353*  BUN 34* 36* 45* 52* 54*  CREATININE 1.39* 1.48* 1.76* 1.78* 1.81*  CALCIUM 9.5 9.3 9.5 9.2 9.3  MG  --   --   --  2.1  --    Liver Function Tests:  Recent Labs Lab 01/29/15 2206  AST 34  ALT 21  ALKPHOS 77  BILITOT 0.5  PROT 7.1  ALBUMIN 3.4*   No results for input(s): LIPASE, AMYLASE in the last 168 hours. No results for input(s): AMMONIA in the last 168 hours. CBC:  Recent Labs Lab 01/28/15 1602 01/29/15 2206 01/31/15 0449  WBC 5.8 6.6 9.2  NEUTROABS 3.6  --   --   HGB 12.1 11.7* 11.0*  HCT 39.2 37.6 35.8*  MCV 84.1 83.7 84.8  PLT 121* 127* 129*   Cardiac Enzymes: No results for input(s): CKTOTAL, CKMB, CKMBINDEX, TROPONINI in the last 168 hours. BNP: Invalid input(s): POCBNP CBG:  Recent Labs Lab 01/31/15 1524 01/31/15 1723 01/31/15 2151 02/01/15 0750 02/01/15 1143  GLUCAP 149* 150* 251* 248* 256*    Time coordinating discharge:  Greater than 30 minutes  Signed:  Shawneequa Baldridge, DO [facility] Pager: [phone] 02/01/2015, 12:07 PM

## 2015-02-03 ENCOUNTER — Encounter (HOSPITAL_COMMUNITY): Payer: Self-pay | Admitting: Vascular Surgery

## 2015-02-03 NOTE — Transfer of Care (Signed)
Immediate Anesthesia Transfer of Care Note  Patient: Brenda [name]  Procedure(s) Performed: Procedure(s): BIOPSY TEMPORAL ARTERY (Right)  Patient Location: PACU  Anesthesia Type:MAC  Level of Consciousness: awake, alert , oriented and patient cooperative  Airway & Oxygen Therapy: Patient Spontanous Breathing and Patient connected to nasal cannula oxygen  Post-op Assessment: Report given to RN, Post -op Vital signs reviewed and stable and Patient moving all extremities  Post vital signs: Reviewed and stable  Last Vitals:  Filed Vitals:   02/01/15 0615  BP: 139/54  Pulse: 56  Temp: 36.4 C  Resp: 16    Complications: No apparent anesthesia complications

## 2015-02-03 NOTE — Anesthesia Postprocedure Evaluation (Signed)
  Anesthesia Post-op Note  Patient: Brenda [name]  Procedure(s) Performed: Procedure(s): BIOPSY TEMPORAL ARTERY (Right)  Patient Location: PACU  Anesthesia Type:MAC  Level of Consciousness: awake, alert  and oriented  Airway and Oxygen Therapy: Patient Spontanous Breathing  Post-op Pain: none  Post-op Assessment: Post-op Vital signs reviewed, Patient's Cardiovascular Status Stable, Respiratory Function Stable and Patent Airway              Post-op Vital Signs: stable  Last Vitals:  Filed Vitals:   02/01/15 0615  BP: 139/54  Pulse: 56  Temp: 36.4 C  Resp: 16    Complications: No apparent anesthesia complications

## 2015-02-03 NOTE — Anesthesia Preprocedure Evaluation (Signed)
Anesthesia Evaluation   Patient awake    Reviewed: Allergy & Precautions, NPO status , Patient's Chart, lab work & pertinent test results  Airway Mallampati: II  TM Distance: >3 FB Neck ROM: Full    Dental  (+) Teeth Intact   Pulmonary    breath sounds clear to auscultation       Cardiovascular hypertension,  Rhythm:Regular Rate:Normal     Neuro/Psych    GI/Hepatic   Endo/Other  diabetes  Renal/GU      Musculoskeletal   Abdominal   Peds  Hematology   Anesthesia Other Findings   Reproductive/Obstetrics                             Anesthesia Physical Anesthesia Plan  ASA: III  Anesthesia Plan: MAC   Post-op Pain Management:    Induction: Intravenous  Airway Management Planned: Natural Airway and Nasal Cannula  Additional Equipment:   Intra-op Plan:   Post-operative Plan:   Informed Consent: I have reviewed the patients History and Physical, chart, labs and discussed the procedure including the risks, benefits and alternatives for the proposed anesthesia with the patient or authorized representative who has indicated his/her understanding and acceptance.     Plan Discussed with: CRNA and Anesthesiologist  Anesthesia Plan Comments:         Anesthesia Quick Evaluation

## 2015-02-04 ENCOUNTER — Telehealth: Payer: Self-pay | Admitting: Vascular Surgery

## 2015-02-04 NOTE — Telephone Encounter (Signed)
Spoke with pt, dpm °

## 2015-02-04 NOTE — Telephone Encounter (Signed)
-----   Message from Sharee PimpleMarilyn K McChesney, RN sent at 02/01/2015  4:00 PM EST ----- Regarding: Schedule   ----- Message -----    From: Fransisco HertzBrian L Chen, MD    Sent: 02/01/2015   8:29 AM      To: [address]  Margo CommonRebecca K Gruwell [phone] [birth year]  F/U 2 weeks with Dr. Edilia Boickson for post-op wound check

## 2015-02-07 DIAGNOSIS — I10 Essential (primary) hypertension: Secondary | ICD-10-CM | POA: Diagnosis not present

## 2015-02-07 DIAGNOSIS — E118 Type 2 diabetes mellitus with unspecified complications: Secondary | ICD-10-CM | POA: Diagnosis not present

## 2015-02-07 DIAGNOSIS — I509 Heart failure, unspecified: Secondary | ICD-10-CM | POA: Diagnosis not present

## 2015-02-07 DIAGNOSIS — M316 Other giant cell arteritis: Secondary | ICD-10-CM | POA: Diagnosis not present

## 2015-02-11 DIAGNOSIS — E789 Disorder of lipoprotein metabolism, unspecified: Secondary | ICD-10-CM | POA: Diagnosis not present

## 2015-02-11 DIAGNOSIS — T50905A Adverse effect of unspecified drugs, medicaments and biological substances, initial encounter: Secondary | ICD-10-CM | POA: Diagnosis not present

## 2015-02-11 DIAGNOSIS — E118 Type 2 diabetes mellitus with unspecified complications: Secondary | ICD-10-CM | POA: Diagnosis not present

## 2015-02-17 ENCOUNTER — Encounter: Payer: Self-pay | Admitting: Vascular Surgery

## 2015-02-19 ENCOUNTER — Ambulatory Visit (INDEPENDENT_AMBULATORY_CARE_PROVIDER_SITE_OTHER): Payer: Medicare Other | Admitting: Vascular Surgery

## 2015-02-19 ENCOUNTER — Encounter: Payer: Self-pay | Admitting: Vascular Surgery

## 2015-02-19 VITALS — BP 152/68 | HR 60 | Temp 98.1°F | Ht 66.0 in | Wt 224.3 lb

## 2015-02-19 DIAGNOSIS — Z48812 Encounter for surgical aftercare following surgery on the circulatory system: Secondary | ICD-10-CM

## 2015-02-19 NOTE — Progress Notes (Signed)
Patient name: Brenda [name] MRN: [id number] DOB: [birth year] Sex: female  REASON FOR VISIT: follow up  HPI: Brenda [name] is a 69 y.o. female who was being worked up for temporal arteritis. I was asked to perform a right temporal artery biopsy. She underwent a right temporal artery biopsy on 01/31/2015. The pathology did not show evidence of temporal arteritis. She comes in for a wound check. She has no specific complaints.  Current Outpatient Prescriptions  Medication Sig Dispense Refill  . acetaminophen (TYLENOL) 500 MG tablet Take 1,000 mg by mouth every 8 (eight) hours as needed for moderate pain. For pain.    Marland Kitchen amLODipine (NORVASC) 5 MG tablet Take 5 mg by mouth daily.    Marland Kitchen aspirin EC 81 MG tablet Take 81 mg by mouth every evening.     . betamethasone valerate (VALISONE) 0.1 % cream Apply 1 application topically daily as needed. For rash    . bimatoprost (LUMIGAN) 0.01 % SOLN Place 1 drop into both eyes at bedtime.      . brimonidine (ALPHAGAN P) 0.1 % SOLN Place 1 drop into both eyes 3 (three) times daily.      . bumetanide (BUMEX) 2 MG tablet Take 2 mg by mouth daily.      . Choline Fenofibrate (TRILIPIX) 45 MG capsule Take 45 mg by mouth every evening.     . cloNIDine (CATAPRES - DOSED IN MG/24 HR) 0.2 mg/24hr patch Place 0.2 mg onto the skin once a week. Every Saturday    . clopidogrel (PLAVIX) 75 MG tablet Take 75 mg by mouth daily.    Marland Kitchen docusate sodium (COLACE) 100 MG capsule Take 100 mg by mouth daily as needed for mild constipation.     Marland Kitchen esomeprazole (NEXIUM) 40 MG capsule Take 40 mg by mouth daily before breakfast.      . exenatide (BYETTA 5 MCG PEN) 5 MCG/0.02ML SOLN Inject 5 mcg into the skin 3 (three) times daily as needed (ONLY USES AS NEEDED FOR HYPERGLYCEMIC EVENTS).     . febuxostat (ULORIC) 40 MG tablet Take 40 mg by mouth daily.    Marland Kitchen gabapentin (NEURONTIN) 300 MG capsule Take 300 mg by mouth at bedtime.    [name] (VASCEPA) 1 G CAPS Take 1 capsule by  mouth 3 (three) times daily.    . insulin detemir (LEVEMIR) 100 UNIT/ML injection Inject 100 Units into the skin 2 (two) times daily.      . insulin lispro (HUMALOG) 100 UNIT/ML injection Inject 20-40 Units into the skin 3 (three) times daily as needed for high blood sugar. Sliding scale.    . isosorbide dinitrate (ISORDIL) 20 MG tablet Take 20 mg by mouth 2 (two) times daily.    Marland Kitchen loratadine (CLARITIN) 10 MG tablet Take 10 mg by mouth every evening.     . meclizine (ANTIVERT) 25 MG tablet Take 25 mg by mouth daily as needed for dizziness.     . mometasone (NASONEX) 50 MCG/ACT nasal spray Place 2 sprays into the nose daily.      . nitroGLYCERIN (NITROSTAT) 0.4 MG SL tablet Place 0.4 mg under the tongue every 5 (five) minutes as needed. For pain.     . predniSONE (DELTASONE) 50 MG tablet Take 1 tablet (50 mg total) by mouth daily with supper. 7 tablet 0  . rosuvastatin (CRESTOR) 20 MG tablet Take 20 mg by mouth every evening.     . simethicone (MYLICON) 125 MG chewable tablet Chew 125 mg  by mouth at bedtime.     . sotalol (BETAPACE) 80 MG tablet Take 80 mg by mouth 2 (two) times daily.      . Timolol Maleate (ISTALOL) 0.5 % (DAILY) SOLN Apply 1 drop to eye every morning. Both eyes     No current facility-administered medications for this visit.    REVIEW OF SYSTEMS:  [X]  denotes positive finding, [ ]  denotes negative finding Cardiac  Comments:  Chest pain or chest pressure:    Shortness of breath upon exertion:    Short of breath when lying flat:    Irregular heart rhythm:    Constitutional    Fever or chills:      PHYSICAL EXAM: Filed Vitals:   02/19/15 1410 02/19/15 1411  BP: 166/70 152/68  Pulse: 60   Temp: 98.1 F (36.7 C)   TempSrc: Oral   Height: 5\' 6"  (1.676 m)   Weight: 224 lb 4.8 oz (101.742 kg)   SpO2: 95%     GENERAL: The patient is a well-nourished female, in no acute distress. The vital signs are documented above. CARDIOVASCULAR: There is a regular rate and  rhythm. PULMONARY: There is good air exchange bilaterally without wheezing or rales. Her incision in the right temporal area is healing nicely.  MEDICAL ISSUES: The patient is doing well status post right temporal artery biopsy. I will see her as needed.  [name], [name] [facility] [place] Beeper: [phone]

## 2015-02-25 ENCOUNTER — Encounter: Payer: Self-pay | Admitting: Endocrinology

## 2015-03-04 DIAGNOSIS — I10 Essential (primary) hypertension: Secondary | ICD-10-CM | POA: Diagnosis not present

## 2015-03-04 DIAGNOSIS — N182 Chronic kidney disease, stage 2 (mild): Secondary | ICD-10-CM | POA: Diagnosis not present

## 2015-03-07 DIAGNOSIS — Z23 Encounter for immunization: Secondary | ICD-10-CM | POA: Diagnosis not present

## 2015-03-07 DIAGNOSIS — E118 Type 2 diabetes mellitus with unspecified complications: Secondary | ICD-10-CM | POA: Diagnosis not present

## 2015-03-07 DIAGNOSIS — T50905A Adverse effect of unspecified drugs, medicaments and biological substances, initial encounter: Secondary | ICD-10-CM | POA: Diagnosis not present

## 2015-03-07 DIAGNOSIS — E789 Disorder of lipoprotein metabolism, unspecified: Secondary | ICD-10-CM | POA: Diagnosis not present

## 2015-03-07 DIAGNOSIS — I10 Essential (primary) hypertension: Secondary | ICD-10-CM | POA: Diagnosis not present

## 2015-03-27 DIAGNOSIS — Z961 Presence of intraocular lens: Secondary | ICD-10-CM | POA: Diagnosis not present

## 2015-03-27 DIAGNOSIS — E119 Type 2 diabetes mellitus without complications: Secondary | ICD-10-CM | POA: Diagnosis not present

## 2015-03-27 DIAGNOSIS — H401431 Capsular glaucoma with pseudoexfoliation of lens, bilateral, mild stage: Secondary | ICD-10-CM | POA: Diagnosis not present

## 2015-03-27 DIAGNOSIS — H539 Unspecified visual disturbance: Secondary | ICD-10-CM | POA: Diagnosis not present

## 2015-04-02 ENCOUNTER — Encounter: Payer: Self-pay | Admitting: Endocrinology

## 2015-04-07 DIAGNOSIS — E789 Disorder of lipoprotein metabolism, unspecified: Secondary | ICD-10-CM | POA: Diagnosis not present

## 2015-04-07 DIAGNOSIS — I10 Essential (primary) hypertension: Secondary | ICD-10-CM | POA: Diagnosis not present

## 2015-04-07 DIAGNOSIS — E118 Type 2 diabetes mellitus with unspecified complications: Secondary | ICD-10-CM | POA: Diagnosis not present

## 2015-04-07 DIAGNOSIS — T887XXA Unspecified adverse effect of drug or medicament, initial encounter: Secondary | ICD-10-CM | POA: Diagnosis not present

## 2015-04-29 DIAGNOSIS — Z951 Presence of aortocoronary bypass graft: Secondary | ICD-10-CM | POA: Diagnosis not present

## 2015-04-29 DIAGNOSIS — E1142 Type 2 diabetes mellitus with diabetic polyneuropathy: Secondary | ICD-10-CM | POA: Diagnosis not present

## 2015-04-29 DIAGNOSIS — E119 Type 2 diabetes mellitus without complications: Secondary | ICD-10-CM | POA: Diagnosis not present

## 2015-04-29 DIAGNOSIS — I509 Heart failure, unspecified: Secondary | ICD-10-CM | POA: Diagnosis not present

## 2015-04-29 DIAGNOSIS — R51 Headache: Secondary | ICD-10-CM | POA: Diagnosis not present

## 2015-04-29 DIAGNOSIS — Z886 Allergy status to analgesic agent status: Secondary | ICD-10-CM | POA: Diagnosis not present

## 2015-04-29 DIAGNOSIS — R140 Abdominal distension (gaseous): Secondary | ICD-10-CM | POA: Diagnosis not present

## 2015-04-29 DIAGNOSIS — R0602 Shortness of breath: Secondary | ICD-10-CM | POA: Diagnosis not present

## 2015-04-29 DIAGNOSIS — I701 Atherosclerosis of renal artery: Secondary | ICD-10-CM | POA: Diagnosis not present

## 2015-04-29 DIAGNOSIS — I110 Hypertensive heart disease with heart failure: Secondary | ICD-10-CM | POA: Diagnosis not present

## 2015-04-29 DIAGNOSIS — R609 Edema, unspecified: Secondary | ICD-10-CM | POA: Diagnosis not present

## 2015-04-29 DIAGNOSIS — Z79899 Other long term (current) drug therapy: Secondary | ICD-10-CM | POA: Diagnosis not present

## 2015-04-29 DIAGNOSIS — Z885 Allergy status to narcotic agent status: Secondary | ICD-10-CM | POA: Diagnosis not present

## 2015-04-29 DIAGNOSIS — R079 Chest pain, unspecified: Secondary | ICD-10-CM | POA: Diagnosis not present

## 2015-04-29 DIAGNOSIS — Z794 Long term (current) use of insulin: Secondary | ICD-10-CM | POA: Diagnosis not present

## 2015-04-29 DIAGNOSIS — I129 Hypertensive chronic kidney disease with stage 1 through stage 4 chronic kidney disease, or unspecified chronic kidney disease: Secondary | ICD-10-CM | POA: Diagnosis not present

## 2015-04-29 DIAGNOSIS — E7800 Pure hypercholesterolemia, unspecified: Secondary | ICD-10-CM | POA: Diagnosis not present

## 2015-04-29 DIAGNOSIS — M797 Fibromyalgia: Secondary | ICD-10-CM | POA: Diagnosis not present

## 2015-04-29 DIAGNOSIS — I447 Left bundle-branch block, unspecified: Secondary | ICD-10-CM | POA: Diagnosis not present

## 2015-04-29 DIAGNOSIS — R069 Unspecified abnormalities of breathing: Secondary | ICD-10-CM | POA: Diagnosis not present

## 2015-04-29 DIAGNOSIS — Z880 Allergy status to penicillin: Secondary | ICD-10-CM | POA: Diagnosis not present

## 2015-04-29 DIAGNOSIS — E1121 Type 2 diabetes mellitus with diabetic nephropathy: Secondary | ICD-10-CM | POA: Diagnosis not present

## 2015-04-29 DIAGNOSIS — I2510 Atherosclerotic heart disease of native coronary artery without angina pectoris: Secondary | ICD-10-CM | POA: Diagnosis not present

## 2015-04-29 DIAGNOSIS — I5031 Acute diastolic (congestive) heart failure: Secondary | ICD-10-CM | POA: Diagnosis not present

## 2015-04-29 DIAGNOSIS — E1122 Type 2 diabetes mellitus with diabetic chronic kidney disease: Secondary | ICD-10-CM | POA: Diagnosis not present

## 2015-04-29 DIAGNOSIS — N183 Chronic kidney disease, stage 3 (moderate): Secondary | ICD-10-CM | POA: Diagnosis not present

## 2015-04-29 DIAGNOSIS — J811 Chronic pulmonary edema: Secondary | ICD-10-CM | POA: Diagnosis not present

## 2015-04-30 DIAGNOSIS — I10 Essential (primary) hypertension: Secondary | ICD-10-CM | POA: Diagnosis not present

## 2015-04-30 DIAGNOSIS — I5031 Acute diastolic (congestive) heart failure: Secondary | ICD-10-CM | POA: Diagnosis not present

## 2015-04-30 DIAGNOSIS — N183 Chronic kidney disease, stage 3 (moderate): Secondary | ICD-10-CM | POA: Diagnosis not present

## 2015-04-30 DIAGNOSIS — I509 Heart failure, unspecified: Secondary | ICD-10-CM | POA: Diagnosis not present

## 2015-05-01 DIAGNOSIS — I5031 Acute diastolic (congestive) heart failure: Secondary | ICD-10-CM | POA: Diagnosis not present

## 2015-05-01 DIAGNOSIS — I10 Essential (primary) hypertension: Secondary | ICD-10-CM | POA: Diagnosis not present

## 2015-05-01 DIAGNOSIS — N183 Chronic kidney disease, stage 3 (moderate): Secondary | ICD-10-CM | POA: Diagnosis not present

## 2015-05-02 DIAGNOSIS — E119 Type 2 diabetes mellitus without complications: Secondary | ICD-10-CM | POA: Diagnosis not present

## 2015-05-02 DIAGNOSIS — I2510 Atherosclerotic heart disease of native coronary artery without angina pectoris: Secondary | ICD-10-CM | POA: Diagnosis not present

## 2015-05-02 DIAGNOSIS — Z794 Long term (current) use of insulin: Secondary | ICD-10-CM | POA: Diagnosis not present

## 2015-05-02 DIAGNOSIS — I10 Essential (primary) hypertension: Secondary | ICD-10-CM | POA: Diagnosis not present

## 2015-05-02 DIAGNOSIS — I5031 Acute diastolic (congestive) heart failure: Secondary | ICD-10-CM | POA: Diagnosis not present

## 2015-05-02 DIAGNOSIS — N183 Chronic kidney disease, stage 3 (moderate): Secondary | ICD-10-CM | POA: Diagnosis not present

## 2015-05-09 DIAGNOSIS — E8779 Other fluid overload: Secondary | ICD-10-CM | POA: Diagnosis not present

## 2015-05-09 DIAGNOSIS — D649 Anemia, unspecified: Secondary | ICD-10-CM | POA: Diagnosis not present

## 2015-05-09 DIAGNOSIS — N183 Chronic kidney disease, stage 3 (moderate): Secondary | ICD-10-CM | POA: Diagnosis not present

## 2015-05-09 DIAGNOSIS — I10 Essential (primary) hypertension: Secondary | ICD-10-CM | POA: Diagnosis not present

## 2015-05-09 DIAGNOSIS — N189 Chronic kidney disease, unspecified: Secondary | ICD-10-CM | POA: Diagnosis not present

## 2015-05-13 DIAGNOSIS — R600 Localized edema: Secondary | ICD-10-CM | POA: Diagnosis not present

## 2015-05-13 DIAGNOSIS — E1122 Type 2 diabetes mellitus with diabetic chronic kidney disease: Secondary | ICD-10-CM | POA: Diagnosis not present

## 2015-05-13 DIAGNOSIS — I2510 Atherosclerotic heart disease of native coronary artery without angina pectoris: Secondary | ICD-10-CM | POA: Diagnosis not present

## 2015-05-13 DIAGNOSIS — I10 Essential (primary) hypertension: Secondary | ICD-10-CM | POA: Diagnosis not present

## 2015-05-15 DIAGNOSIS — I10 Essential (primary) hypertension: Secondary | ICD-10-CM | POA: Diagnosis not present

## 2015-05-15 DIAGNOSIS — I509 Heart failure, unspecified: Secondary | ICD-10-CM | POA: Diagnosis not present

## 2015-05-15 DIAGNOSIS — E118 Type 2 diabetes mellitus with unspecified complications: Secondary | ICD-10-CM | POA: Diagnosis not present

## 2015-06-04 DIAGNOSIS — E119 Type 2 diabetes mellitus without complications: Secondary | ICD-10-CM | POA: Diagnosis not present

## 2015-06-04 DIAGNOSIS — D649 Anemia, unspecified: Secondary | ICD-10-CM | POA: Diagnosis not present

## 2015-06-04 DIAGNOSIS — N189 Chronic kidney disease, unspecified: Secondary | ICD-10-CM | POA: Diagnosis not present

## 2015-06-04 DIAGNOSIS — I10 Essential (primary) hypertension: Secondary | ICD-10-CM | POA: Diagnosis not present

## 2015-06-04 DIAGNOSIS — N182 Chronic kidney disease, stage 2 (mild): Secondary | ICD-10-CM | POA: Diagnosis not present

## 2015-06-10 DIAGNOSIS — E1165 Type 2 diabetes mellitus with hyperglycemia: Secondary | ICD-10-CM | POA: Diagnosis not present

## 2015-06-10 DIAGNOSIS — H4921 Sixth [abducent] nerve palsy, right eye: Secondary | ICD-10-CM | POA: Diagnosis not present

## 2015-06-10 DIAGNOSIS — E113293 Type 2 diabetes mellitus with mild nonproliferative diabetic retinopathy without macular edema, bilateral: Secondary | ICD-10-CM | POA: Diagnosis not present

## 2015-06-10 DIAGNOSIS — H401431 Capsular glaucoma with pseudoexfoliation of lens, bilateral, mild stage: Secondary | ICD-10-CM | POA: Diagnosis not present

## 2015-06-18 DIAGNOSIS — R600 Localized edema: Secondary | ICD-10-CM | POA: Diagnosis not present

## 2015-06-18 DIAGNOSIS — I10 Essential (primary) hypertension: Secondary | ICD-10-CM | POA: Diagnosis not present

## 2015-06-18 DIAGNOSIS — E1122 Type 2 diabetes mellitus with diabetic chronic kidney disease: Secondary | ICD-10-CM | POA: Diagnosis not present

## 2015-06-18 DIAGNOSIS — I2510 Atherosclerotic heart disease of native coronary artery without angina pectoris: Secondary | ICD-10-CM | POA: Diagnosis not present

## 2015-06-27 DIAGNOSIS — E118 Type 2 diabetes mellitus with unspecified complications: Secondary | ICD-10-CM | POA: Diagnosis not present

## 2015-06-27 DIAGNOSIS — I10 Essential (primary) hypertension: Secondary | ICD-10-CM | POA: Diagnosis not present

## 2015-06-27 DIAGNOSIS — M5137 Other intervertebral disc degeneration, lumbosacral region: Secondary | ICD-10-CM | POA: Diagnosis not present

## 2015-07-01 DIAGNOSIS — H6120 Impacted cerumen, unspecified ear: Secondary | ICD-10-CM | POA: Diagnosis not present

## 2015-07-24 DIAGNOSIS — H401431 Capsular glaucoma with pseudoexfoliation of lens, bilateral, mild stage: Secondary | ICD-10-CM | POA: Diagnosis not present

## 2015-07-24 DIAGNOSIS — H2511 Age-related nuclear cataract, right eye: Secondary | ICD-10-CM | POA: Diagnosis not present

## 2015-07-24 DIAGNOSIS — Z961 Presence of intraocular lens: Secondary | ICD-10-CM | POA: Diagnosis not present

## 2015-07-24 DIAGNOSIS — H18411 Arcus senilis, right eye: Secondary | ICD-10-CM | POA: Diagnosis not present

## 2015-07-31 DIAGNOSIS — E118 Type 2 diabetes mellitus with unspecified complications: Secondary | ICD-10-CM | POA: Diagnosis not present

## 2015-07-31 DIAGNOSIS — I10 Essential (primary) hypertension: Secondary | ICD-10-CM | POA: Diagnosis not present

## 2015-07-31 DIAGNOSIS — E789 Disorder of lipoprotein metabolism, unspecified: Secondary | ICD-10-CM | POA: Diagnosis not present

## 2015-07-31 DIAGNOSIS — I2510 Atherosclerotic heart disease of native coronary artery without angina pectoris: Secondary | ICD-10-CM | POA: Diagnosis not present

## 2015-09-03 DIAGNOSIS — M79672 Pain in left foot: Secondary | ICD-10-CM | POA: Diagnosis not present

## 2015-09-03 DIAGNOSIS — L84 Corns and callosities: Secondary | ICD-10-CM | POA: Diagnosis not present

## 2015-09-03 DIAGNOSIS — L602 Onychogryphosis: Secondary | ICD-10-CM | POA: Diagnosis not present

## 2015-09-03 DIAGNOSIS — E1142 Type 2 diabetes mellitus with diabetic polyneuropathy: Secondary | ICD-10-CM | POA: Diagnosis not present

## 2015-09-03 DIAGNOSIS — M79671 Pain in right foot: Secondary | ICD-10-CM | POA: Diagnosis not present

## 2015-09-08 DIAGNOSIS — G629 Polyneuropathy, unspecified: Secondary | ICD-10-CM | POA: Diagnosis not present

## 2015-09-08 DIAGNOSIS — E118 Type 2 diabetes mellitus with unspecified complications: Secondary | ICD-10-CM | POA: Diagnosis not present

## 2015-09-08 DIAGNOSIS — L570 Actinic keratosis: Secondary | ICD-10-CM | POA: Diagnosis not present

## 2015-09-10 DIAGNOSIS — H269 Unspecified cataract: Secondary | ICD-10-CM | POA: Diagnosis not present

## 2015-09-10 DIAGNOSIS — H539 Unspecified visual disturbance: Secondary | ICD-10-CM | POA: Diagnosis not present

## 2015-09-10 DIAGNOSIS — N189 Chronic kidney disease, unspecified: Secondary | ICD-10-CM | POA: Diagnosis not present

## 2015-09-10 DIAGNOSIS — Z882 Allergy status to sulfonamides status: Secondary | ICD-10-CM | POA: Diagnosis not present

## 2015-09-10 DIAGNOSIS — E785 Hyperlipidemia, unspecified: Secondary | ICD-10-CM | POA: Diagnosis not present

## 2015-09-10 DIAGNOSIS — Z79899 Other long term (current) drug therapy: Secondary | ICD-10-CM | POA: Diagnosis not present

## 2015-09-10 DIAGNOSIS — Z6834 Body mass index (BMI) 34.0-34.9, adult: Secondary | ICD-10-CM | POA: Diagnosis not present

## 2015-09-10 DIAGNOSIS — H2511 Age-related nuclear cataract, right eye: Secondary | ICD-10-CM | POA: Diagnosis not present

## 2015-09-10 DIAGNOSIS — Z8679 Personal history of other diseases of the circulatory system: Secondary | ICD-10-CM | POA: Diagnosis not present

## 2015-09-10 DIAGNOSIS — G4730 Sleep apnea, unspecified: Secondary | ICD-10-CM | POA: Diagnosis not present

## 2015-09-10 DIAGNOSIS — I129 Hypertensive chronic kidney disease with stage 1 through stage 4 chronic kidney disease, or unspecified chronic kidney disease: Secondary | ICD-10-CM | POA: Diagnosis not present

## 2015-09-10 DIAGNOSIS — Z889 Allergy status to unspecified drugs, medicaments and biological substances status: Secondary | ICD-10-CM | POA: Diagnosis not present

## 2015-09-10 DIAGNOSIS — E1136 Type 2 diabetes mellitus with diabetic cataract: Secondary | ICD-10-CM | POA: Diagnosis not present

## 2015-09-10 DIAGNOSIS — E1122 Type 2 diabetes mellitus with diabetic chronic kidney disease: Secondary | ICD-10-CM | POA: Diagnosis not present

## 2015-09-10 DIAGNOSIS — Z886 Allergy status to analgesic agent status: Secondary | ICD-10-CM | POA: Diagnosis not present

## 2015-09-10 DIAGNOSIS — E669 Obesity, unspecified: Secondary | ICD-10-CM | POA: Diagnosis not present

## 2015-09-10 DIAGNOSIS — Z880 Allergy status to penicillin: Secondary | ICD-10-CM | POA: Diagnosis not present

## 2015-09-10 DIAGNOSIS — Z794 Long term (current) use of insulin: Secondary | ICD-10-CM | POA: Diagnosis not present

## 2015-09-16 DIAGNOSIS — Z1231 Encounter for screening mammogram for malignant neoplasm of breast: Secondary | ICD-10-CM | POA: Diagnosis not present

## 2015-11-06 ENCOUNTER — Encounter: Payer: Self-pay | Admitting: Endocrinology

## 2016-01-13 DIAGNOSIS — H16223 Keratoconjunctivitis sicca, not specified as Sjogren's, bilateral: Secondary | ICD-10-CM | POA: Diagnosis not present

## 2016-01-13 DIAGNOSIS — H401112 Primary open-angle glaucoma, right eye, moderate stage: Secondary | ICD-10-CM | POA: Diagnosis not present

## 2016-01-13 DIAGNOSIS — H04123 Dry eye syndrome of bilateral lacrimal glands: Secondary | ICD-10-CM | POA: Diagnosis not present

## 2016-01-13 DIAGNOSIS — H401122 Primary open-angle glaucoma, left eye, moderate stage: Secondary | ICD-10-CM | POA: Diagnosis not present

## 2016-01-22 DIAGNOSIS — I10 Essential (primary) hypertension: Secondary | ICD-10-CM | POA: Diagnosis not present

## 2016-01-22 DIAGNOSIS — E789 Disorder of lipoprotein metabolism, unspecified: Secondary | ICD-10-CM | POA: Diagnosis not present

## 2016-01-22 DIAGNOSIS — E118 Type 2 diabetes mellitus with unspecified complications: Secondary | ICD-10-CM | POA: Diagnosis not present

## 2016-01-22 DIAGNOSIS — N289 Disorder of kidney and ureter, unspecified: Secondary | ICD-10-CM | POA: Diagnosis not present

## 2016-02-04 DIAGNOSIS — I10 Essential (primary) hypertension: Secondary | ICD-10-CM | POA: Diagnosis not present

## 2016-02-04 DIAGNOSIS — N183 Chronic kidney disease, stage 3 (moderate): Secondary | ICD-10-CM | POA: Diagnosis not present

## 2016-03-09 DIAGNOSIS — L602 Onychogryphosis: Secondary | ICD-10-CM | POA: Diagnosis not present

## 2016-03-09 DIAGNOSIS — E1142 Type 2 diabetes mellitus with diabetic polyneuropathy: Secondary | ICD-10-CM | POA: Diagnosis not present

## 2016-03-09 DIAGNOSIS — M79671 Pain in right foot: Secondary | ICD-10-CM | POA: Diagnosis not present

## 2016-03-09 DIAGNOSIS — M79672 Pain in left foot: Secondary | ICD-10-CM | POA: Diagnosis not present

## 2016-03-09 DIAGNOSIS — L84 Corns and callosities: Secondary | ICD-10-CM | POA: Diagnosis not present

## 2016-03-10 DIAGNOSIS — I10 Essential (primary) hypertension: Secondary | ICD-10-CM | POA: Diagnosis not present

## 2016-03-10 DIAGNOSIS — Z23 Encounter for immunization: Secondary | ICD-10-CM | POA: Diagnosis not present

## 2016-03-10 DIAGNOSIS — E118 Type 2 diabetes mellitus with unspecified complications: Secondary | ICD-10-CM | POA: Diagnosis not present

## 2016-05-20 DIAGNOSIS — I10 Essential (primary) hypertension: Secondary | ICD-10-CM | POA: Diagnosis not present

## 2016-05-20 DIAGNOSIS — Z79899 Other long term (current) drug therapy: Secondary | ICD-10-CM | POA: Diagnosis not present

## 2016-05-20 DIAGNOSIS — N289 Disorder of kidney and ureter, unspecified: Secondary | ICD-10-CM | POA: Diagnosis not present

## 2016-05-20 DIAGNOSIS — Z0000 Encounter for general adult medical examination without abnormal findings: Secondary | ICD-10-CM | POA: Diagnosis not present

## 2016-05-20 DIAGNOSIS — E789 Disorder of lipoprotein metabolism, unspecified: Secondary | ICD-10-CM | POA: Diagnosis not present

## 2016-05-20 DIAGNOSIS — E119 Type 2 diabetes mellitus without complications: Secondary | ICD-10-CM | POA: Diagnosis not present

## 2016-05-20 DIAGNOSIS — E118 Type 2 diabetes mellitus with unspecified complications: Secondary | ICD-10-CM | POA: Diagnosis not present

## 2016-06-02 DIAGNOSIS — K5900 Constipation, unspecified: Secondary | ICD-10-CM | POA: Diagnosis not present

## 2016-06-02 DIAGNOSIS — Z794 Long term (current) use of insulin: Secondary | ICD-10-CM | POA: Diagnosis not present

## 2016-06-02 DIAGNOSIS — E119 Type 2 diabetes mellitus without complications: Secondary | ICD-10-CM | POA: Diagnosis not present

## 2016-06-02 DIAGNOSIS — R1084 Generalized abdominal pain: Secondary | ICD-10-CM | POA: Diagnosis not present

## 2016-06-09 DIAGNOSIS — N182 Chronic kidney disease, stage 2 (mild): Secondary | ICD-10-CM | POA: Diagnosis not present

## 2016-06-09 DIAGNOSIS — E119 Type 2 diabetes mellitus without complications: Secondary | ICD-10-CM | POA: Diagnosis not present

## 2016-06-09 DIAGNOSIS — I10 Essential (primary) hypertension: Secondary | ICD-10-CM | POA: Diagnosis not present

## 2016-06-10 DIAGNOSIS — L988 Other specified disorders of the skin and subcutaneous tissue: Secondary | ICD-10-CM | POA: Diagnosis not present

## 2016-06-10 DIAGNOSIS — N281 Cyst of kidney, acquired: Secondary | ICD-10-CM | POA: Diagnosis not present

## 2016-06-10 DIAGNOSIS — R162 Hepatomegaly with splenomegaly, not elsewhere classified: Secondary | ICD-10-CM | POA: Diagnosis not present

## 2016-06-14 DIAGNOSIS — R935 Abnormal findings on diagnostic imaging of other abdominal regions, including retroperitoneum: Secondary | ICD-10-CM | POA: Diagnosis not present

## 2016-06-14 DIAGNOSIS — K5900 Constipation, unspecified: Secondary | ICD-10-CM | POA: Diagnosis not present

## 2016-06-14 DIAGNOSIS — R1084 Generalized abdominal pain: Secondary | ICD-10-CM | POA: Diagnosis not present

## 2016-06-14 DIAGNOSIS — E119 Type 2 diabetes mellitus without complications: Secondary | ICD-10-CM | POA: Diagnosis not present

## 2016-06-24 DIAGNOSIS — R1084 Generalized abdominal pain: Secondary | ICD-10-CM | POA: Diagnosis not present

## 2016-06-24 DIAGNOSIS — R933 Abnormal findings on diagnostic imaging of other parts of digestive tract: Secondary | ICD-10-CM | POA: Diagnosis not present

## 2016-06-24 DIAGNOSIS — K317 Polyp of stomach and duodenum: Secondary | ICD-10-CM | POA: Diagnosis not present

## 2016-06-30 DIAGNOSIS — K317 Polyp of stomach and duodenum: Secondary | ICD-10-CM | POA: Diagnosis not present

## 2016-07-07 DIAGNOSIS — H401122 Primary open-angle glaucoma, left eye, moderate stage: Secondary | ICD-10-CM | POA: Diagnosis not present

## 2016-07-07 DIAGNOSIS — H04123 Dry eye syndrome of bilateral lacrimal glands: Secondary | ICD-10-CM | POA: Diagnosis not present

## 2016-07-07 DIAGNOSIS — H16223 Keratoconjunctivitis sicca, not specified as Sjogren's, bilateral: Secondary | ICD-10-CM | POA: Diagnosis not present

## 2016-07-07 DIAGNOSIS — H401112 Primary open-angle glaucoma, right eye, moderate stage: Secondary | ICD-10-CM | POA: Diagnosis not present

## 2016-07-29 DIAGNOSIS — R109 Unspecified abdominal pain: Secondary | ICD-10-CM | POA: Diagnosis not present

## 2016-07-29 DIAGNOSIS — I10 Essential (primary) hypertension: Secondary | ICD-10-CM | POA: Diagnosis not present

## 2016-07-29 DIAGNOSIS — G4700 Insomnia, unspecified: Secondary | ICD-10-CM | POA: Diagnosis not present

## 2016-07-29 DIAGNOSIS — E789 Disorder of lipoprotein metabolism, unspecified: Secondary | ICD-10-CM | POA: Diagnosis not present

## 2016-07-29 DIAGNOSIS — E118 Type 2 diabetes mellitus with unspecified complications: Secondary | ICD-10-CM | POA: Diagnosis not present

## 2016-08-25 DIAGNOSIS — I10 Essential (primary) hypertension: Secondary | ICD-10-CM | POA: Diagnosis not present

## 2016-08-25 DIAGNOSIS — E789 Disorder of lipoprotein metabolism, unspecified: Secondary | ICD-10-CM | POA: Diagnosis not present

## 2016-08-25 DIAGNOSIS — I2510 Atherosclerotic heart disease of native coronary artery without angina pectoris: Secondary | ICD-10-CM | POA: Diagnosis not present

## 2016-08-25 DIAGNOSIS — E118 Type 2 diabetes mellitus with unspecified complications: Secondary | ICD-10-CM | POA: Diagnosis not present

## 2016-08-27 ENCOUNTER — Encounter: Payer: Self-pay | Admitting: Endocrinology

## 2016-09-15 DIAGNOSIS — M21622 Bunionette of left foot: Secondary | ICD-10-CM | POA: Diagnosis not present

## 2016-09-15 DIAGNOSIS — L97529 Non-pressure chronic ulcer of other part of left foot with unspecified severity: Secondary | ICD-10-CM | POA: Diagnosis not present

## 2016-09-15 DIAGNOSIS — M2042 Other hammer toe(s) (acquired), left foot: Secondary | ICD-10-CM | POA: Diagnosis not present

## 2016-09-15 DIAGNOSIS — E11621 Type 2 diabetes mellitus with foot ulcer: Secondary | ICD-10-CM | POA: Diagnosis not present

## 2016-09-23 DIAGNOSIS — E118 Type 2 diabetes mellitus with unspecified complications: Secondary | ICD-10-CM | POA: Diagnosis not present

## 2016-09-23 DIAGNOSIS — G629 Polyneuropathy, unspecified: Secondary | ICD-10-CM | POA: Diagnosis not present

## 2016-09-23 DIAGNOSIS — I10 Essential (primary) hypertension: Secondary | ICD-10-CM | POA: Diagnosis not present

## 2016-09-23 DIAGNOSIS — N289 Disorder of kidney and ureter, unspecified: Secondary | ICD-10-CM | POA: Diagnosis not present

## 2016-11-30 DIAGNOSIS — H04123 Dry eye syndrome of bilateral lacrimal glands: Secondary | ICD-10-CM | POA: Diagnosis not present

## 2016-11-30 DIAGNOSIS — H401112 Primary open-angle glaucoma, right eye, moderate stage: Secondary | ICD-10-CM | POA: Diagnosis not present

## 2016-11-30 DIAGNOSIS — H401122 Primary open-angle glaucoma, left eye, moderate stage: Secondary | ICD-10-CM | POA: Diagnosis not present

## 2016-11-30 DIAGNOSIS — H16223 Keratoconjunctivitis sicca, not specified as Sjogren's, bilateral: Secondary | ICD-10-CM | POA: Diagnosis not present

## 2016-12-20 DIAGNOSIS — D649 Anemia, unspecified: Secondary | ICD-10-CM | POA: Diagnosis not present

## 2016-12-20 DIAGNOSIS — R809 Proteinuria, unspecified: Secondary | ICD-10-CM | POA: Diagnosis not present

## 2016-12-20 DIAGNOSIS — I10 Essential (primary) hypertension: Secondary | ICD-10-CM | POA: Diagnosis not present

## 2016-12-20 DIAGNOSIS — E7800 Pure hypercholesterolemia, unspecified: Secondary | ICD-10-CM | POA: Diagnosis not present

## 2016-12-20 DIAGNOSIS — E119 Type 2 diabetes mellitus without complications: Secondary | ICD-10-CM | POA: Diagnosis not present

## 2016-12-20 DIAGNOSIS — N189 Chronic kidney disease, unspecified: Secondary | ICD-10-CM | POA: Diagnosis not present

## 2016-12-20 DIAGNOSIS — N182 Chronic kidney disease, stage 2 (mild): Secondary | ICD-10-CM | POA: Diagnosis not present

## 2016-12-20 DIAGNOSIS — M109 Gout, unspecified: Secondary | ICD-10-CM | POA: Diagnosis not present

## 2016-12-20 DIAGNOSIS — Z23 Encounter for immunization: Secondary | ICD-10-CM | POA: Diagnosis not present

## 2017-08-06 IMAGING — MR MR HEAD W/O CM
9 of 10 series · 36 of 48 positions shown · non-contrast
Comparison: None.

CLINICAL DATA: Blurred vision.  Diplopia.

EXAM:
MRI HEAD WITHOUT CONTRAST
TECHNIQUE: Multiplanar, multiecho pulse sequences of the brain and surrounding
structures were obtained without intravenous contrast.

[Series 3: DWI · axial · 3.0mm · 1.09mm/px · z∈[-81,+54]mm · 9 of 94 slices shown (1 of 4)]
[im 1/94]
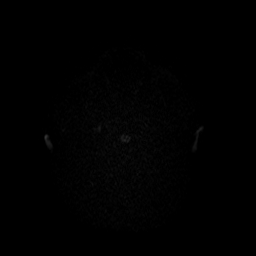
[im 12/94]
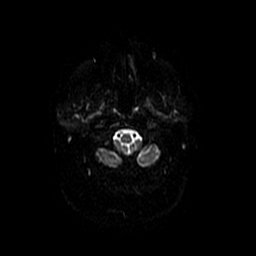
[im 24/94]
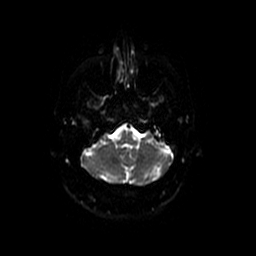
[im 35/94]
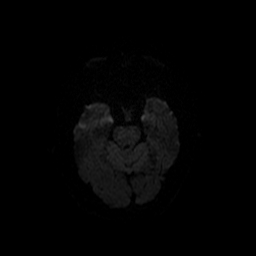
[im 47/94]
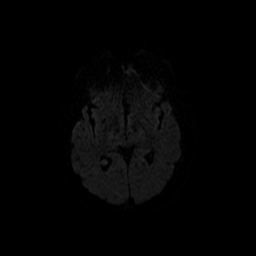
[im 59/94]
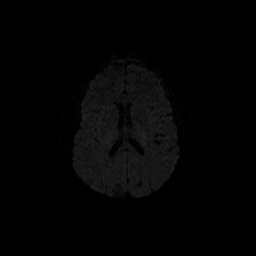
[im 70/94]
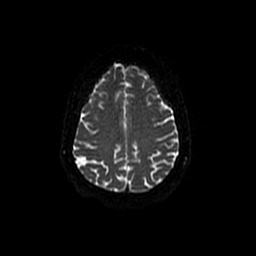
[im 82/94]
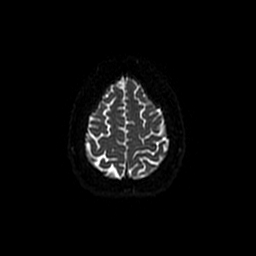
[im 94/94]
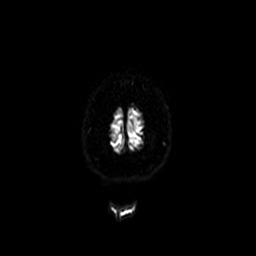

[Series 4: DWI · coronal · 5.0mm · 1.09mm/px · 7 of 66 slices shown (2 of 4)]
[im 1/66]
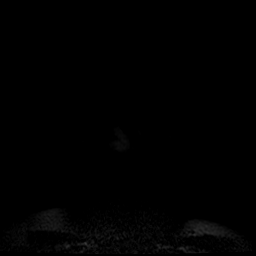
[im 11/66]
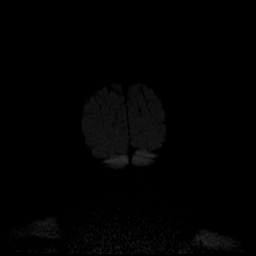
[im 22/66]
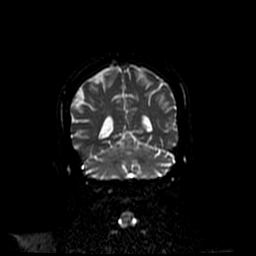
[im 33/66]
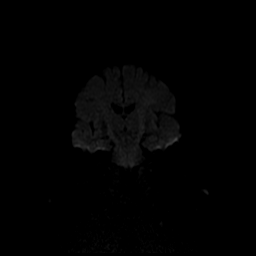
[im 44/66]
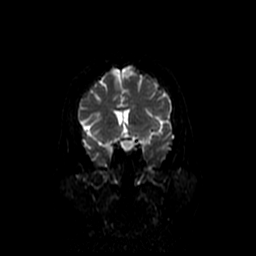
[im 55/66]
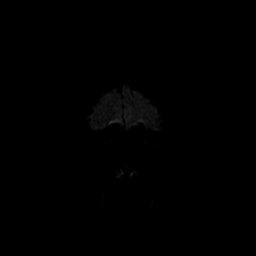
[im 66/66]
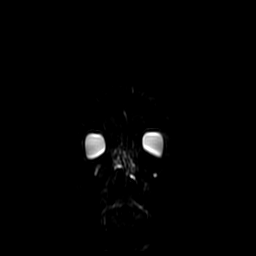

[Series 5: T1 · sagittal · 5.0mm · 0.47mm/px · 2 of 23 slices shown]
[im 1/23]
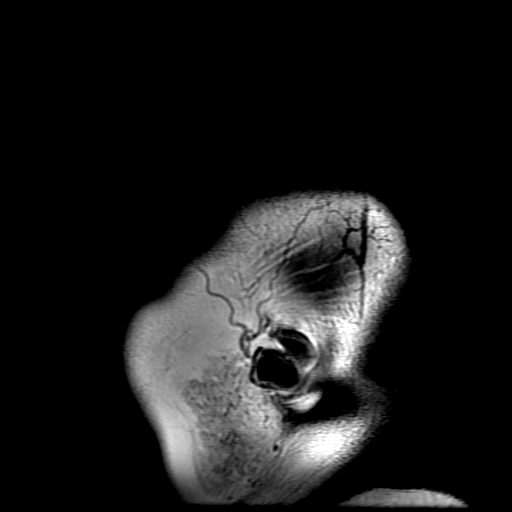
[im 23/23]
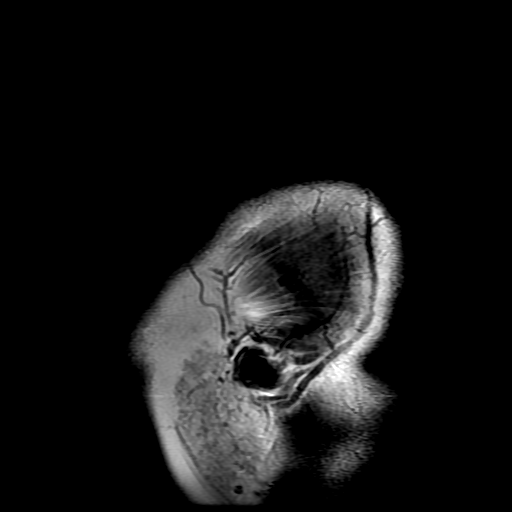

[Series 6: T2 · axial · 5.0mm · 0.43mm/px · z∈[-86,+49]mm · 3 of 24 slices shown (1 of 2)]
[im 1/24]
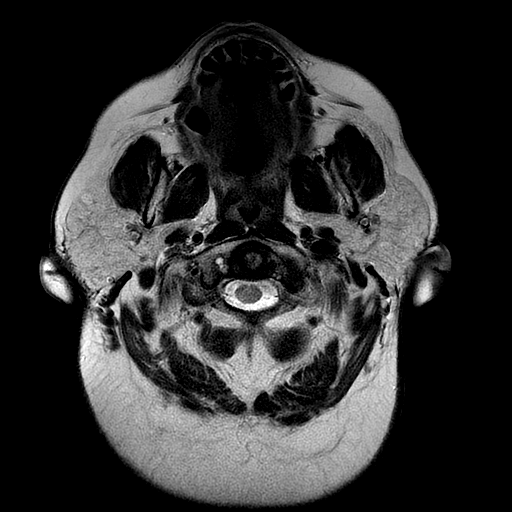
[im 12/24]
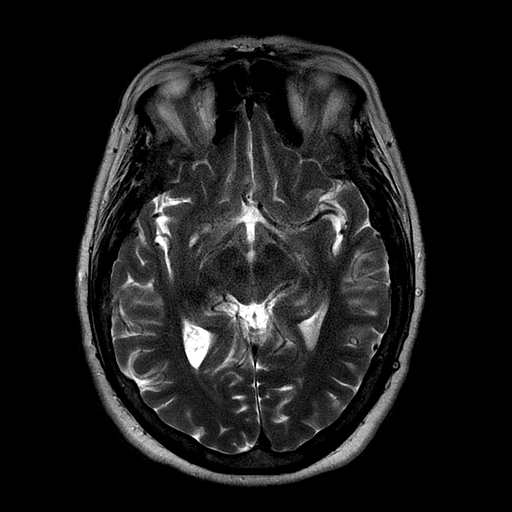
[im 24/24]
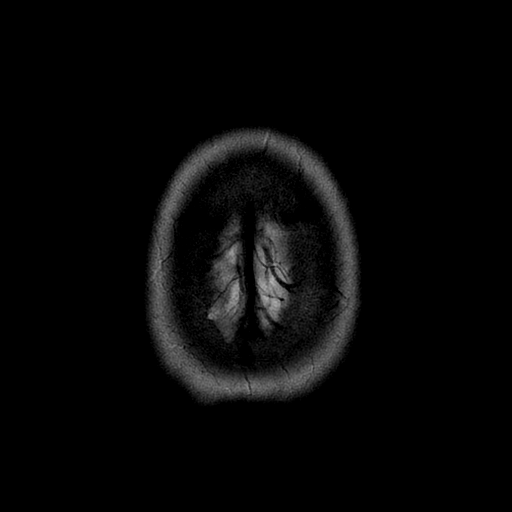

[Series 7: FLAIR · axial · 5.0mm · 0.43mm/px · z∈[-86,+49]mm · 3 of 24 slices shown]
[im 1/24]
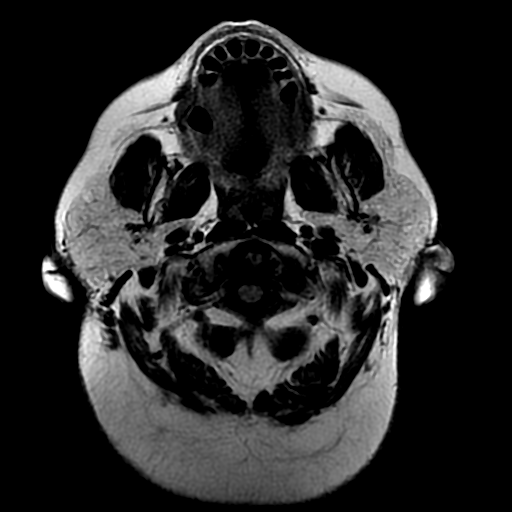
[im 12/24]
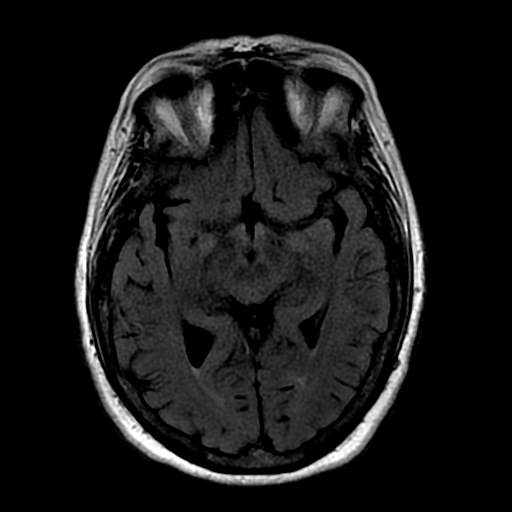
[im 24/24]
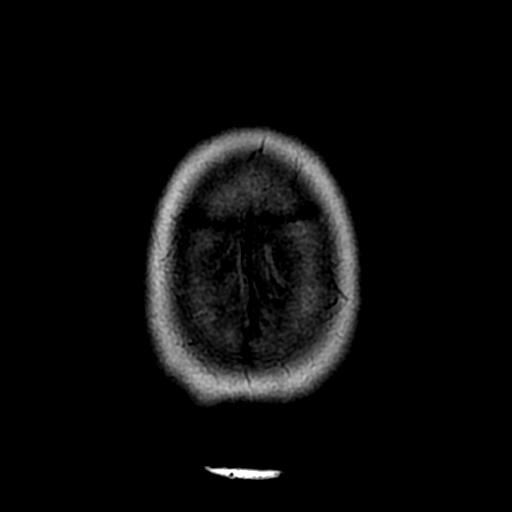

[Series 8: ax mpgr · axial · 5.0mm · 0.43mm/px · 1 of 24 slices shown]
[im 1/24]
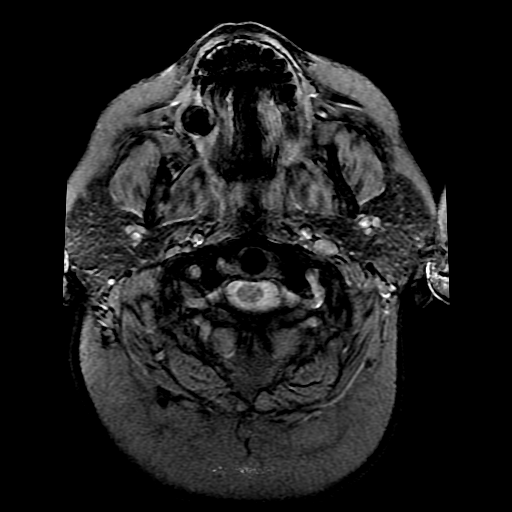

[Series 10: T2 · coronal · 5.0mm · 0.43mm/px · 3 of 28 slices shown (2 of 2)]
[im 1/28]
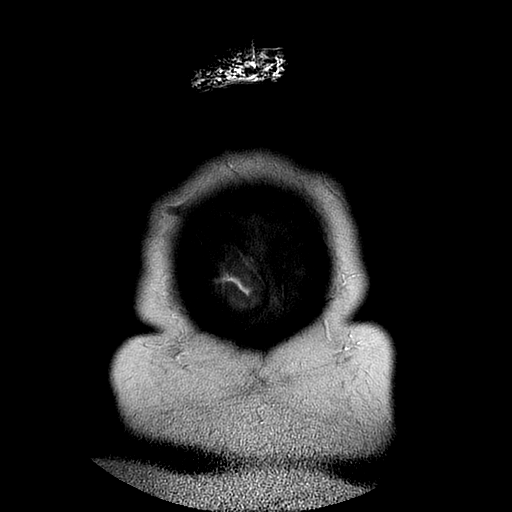
[im 14/28]
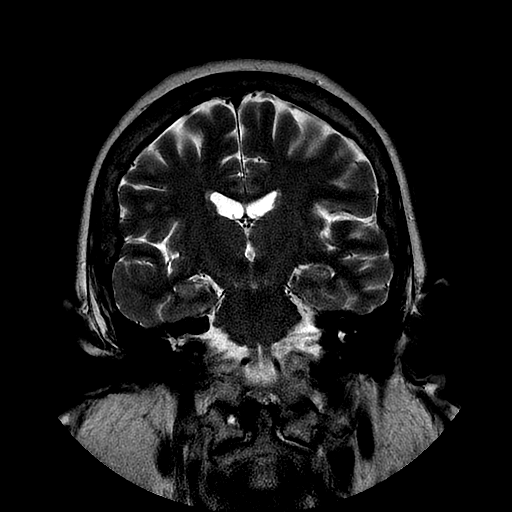
[im 28/28]
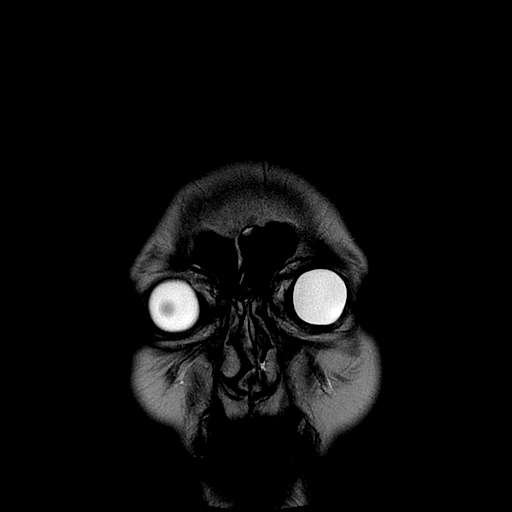

[Series 300: DWI · axial · 3.0mm · 1.09mm/px · z∈[-81,+54]mm · 5 of 47 slices shown (3 of 4)]
[im 1/47]
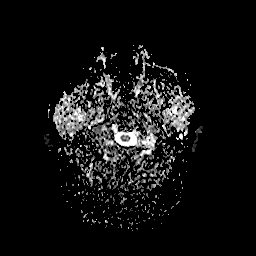
[im 12/47]
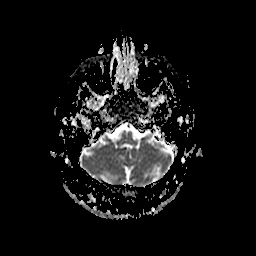
[im 24/47]
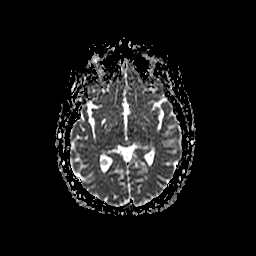
[im 35/47]
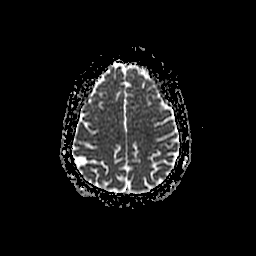
[im 47/47]
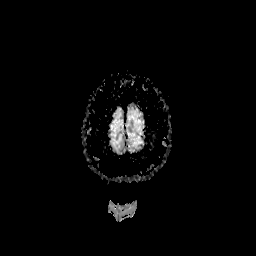

[Series 400: DWI · coronal · 5.0mm · 1.09mm/px · 3 of 33 slices shown (4 of 4)]
[im 1/33]
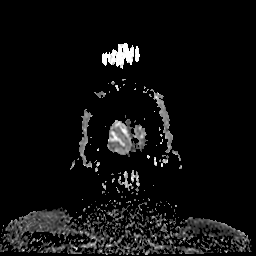
[im 17/33]
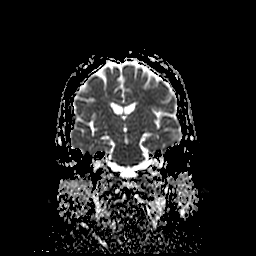
[im 33/33]
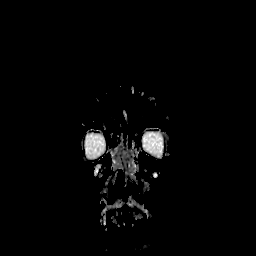

[36 of 48 positions shown; findings below may reference images not displayed]

FINDINGS: Ventricle size normal. Cerebral volume normal. Pituitary not
enlarged. No compression of the optic chiasm.

Negative for acute infarct.

Small hyperintensities in the right parietal white matter consistent
with mild chronic microvascular ischemia. Brainstem and basal
ganglia normal.

Negative for intracranial hemorrhage.  Negative for mass or edema

Paranasal sinuses clear.  Normal orbit.
IMPRESSION: No acute intracranial abnormality.

Mild chronic microvascular ischemic change in the white matter.

## 2018-04-25 ENCOUNTER — Other Ambulatory Visit: Payer: Self-pay

## 2018-05-04 ENCOUNTER — Other Ambulatory Visit: Payer: Self-pay

## 2018-05-05 ENCOUNTER — Other Ambulatory Visit: Payer: Self-pay

## 2018-05-05 MED ORDER — SPIRONOLACTONE 25 MG PO TABS: 25.0000 mg | ORAL_TABLET | Freq: Two times a day (BID) | ORAL | 1 refills | Status: DC

## 2018-05-26 ENCOUNTER — Other Ambulatory Visit: Payer: Self-pay

## 2018-06-09 ENCOUNTER — Other Ambulatory Visit: Payer: Self-pay

## 2018-06-09 DIAGNOSIS — I5031 Acute diastolic (congestive) heart failure: Secondary | ICD-10-CM

## 2018-06-09 MED ORDER — SPIRONOLACTONE 25 MG PO TABS: 25.0000 mg | ORAL_TABLET | Freq: Two times a day (BID) | ORAL | 3 refills | Status: AC

## 2018-07-12 ENCOUNTER — Ambulatory Visit (INDEPENDENT_AMBULATORY_CARE_PROVIDER_SITE_OTHER): Payer: Medicare Other | Admitting: Cardiology

## 2018-07-12 ENCOUNTER — Encounter: Payer: Self-pay | Admitting: Cardiology

## 2018-07-12 ENCOUNTER — Other Ambulatory Visit: Payer: Self-pay

## 2018-07-12 VITALS — BP 138/62 | HR 60 | Ht 66.0 in | Wt 192.0 lb

## 2018-07-12 DIAGNOSIS — I129 Hypertensive chronic kidney disease with stage 1 through stage 4 chronic kidney disease, or unspecified chronic kidney disease: Secondary | ICD-10-CM

## 2018-07-12 DIAGNOSIS — Z794 Long term (current) use of insulin: Secondary | ICD-10-CM

## 2018-07-12 DIAGNOSIS — M79604 Pain in right leg: Secondary | ICD-10-CM

## 2018-07-12 DIAGNOSIS — E0822 Diabetes mellitus due to underlying condition with diabetic chronic kidney disease: Secondary | ICD-10-CM

## 2018-07-12 DIAGNOSIS — M79605 Pain in left leg: Secondary | ICD-10-CM

## 2018-07-12 DIAGNOSIS — E7800 Pure hypercholesterolemia, unspecified: Secondary | ICD-10-CM

## 2018-07-12 DIAGNOSIS — N183 Chronic kidney disease, stage 3 (moderate): Secondary | ICD-10-CM

## 2018-07-12 DIAGNOSIS — I5032 Chronic diastolic (congestive) heart failure: Secondary | ICD-10-CM

## 2018-07-12 DIAGNOSIS — I25118 Atherosclerotic heart disease of native coronary artery with other forms of angina pectoris: Secondary | ICD-10-CM

## 2018-07-12 DIAGNOSIS — I10 Essential (primary) hypertension: Secondary | ICD-10-CM

## 2018-07-12 HISTORY — DX: Essential (primary) hypertension: I10

## 2018-07-12 NOTE — Progress Notes (Signed)
Primary Physician/Referring:  Verdell Carmine., MD  Patient ID: Brenda [name], female    DOB: 19-Jul-1945, 73 y.o.   MRN: [id number]  Chief Complaint  Patient presents with  . Atrial Fibrillation  . Follow-up    65mo   HPI: Brenda [name] is a 73y.o. female  with CAD and CABG x 3 in 2005, patent grafts in 2012 except for occluded SVG to RCA, however the RCA had minimal disease. She had remote atrial fibrillation in 2005 and has been on sotalol without recurrence. 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 She also has chronic leg edema and uses support stockings regularly.   In Feb 2020 had developed diabetic foot ulcer in the right foot at the site of chronic callus and this has healed.   Recently her blood sugar has been very uncontrolled and makes her feel tired.  She has chronic dyspnea on exertion, otherwise no other specific symptoms.  She has also been having chest pain described as tightness and has to use NTG 1-2 a day over the past 2 weeks with relief.   Past Medical History:  Diagnosis Date  . A-fib ([facility]   . Angina at rest ([facility]   . Arthritis    "back, knees, feet" (01/30/2015)  . CAD (coronary artery disease) 02/20/2011  . CHF (congestive heart failure) ([facility]   . CHRONIC KIDNEY DISEASE STAGE III (MODERATE) 05/12/2009  . Chronic low back pain    "into hips"  . Cirrhosis ([facility]   . Di[place] peripheral neuropathy ([place]   . DVT (deep venous thrombosis) ([place] ~ 2002   RLE  . Essential hypertension 07/12/2018  . Fibromyalgia   . GERD (gastroesophageal reflux disease)   . Glaucoma, both eyes   . Gout   . Headache    "~ q week" (01/30/2015)  . History of blood transfusion 2005   "related to bypass OR"  . Hyperlipemia   . Hypertension   . Jaundice ~ 1952   "yellow jaundice"  . NAFLD (nonalcoholic fatty liver disease) 02/20/2011  .  Renal disorder   . Sleep apnea   . Thrombocytopenia ([facility] 02/21/2011  . Type II diabetes mellitus ([facility]   . Ulcer of lower limb, unspecified 05/12/2009    Past Surgical History:  Procedure Laterality Date  . ABDOMINAL HYSTERECTOMY  1980  . ANAL FISSURE REPAIR  ~ 1966   "w/fistula"  . APPENDECTOMY  ~ 1971  . ARTERY BIOPSY Right 01/31/2015   Procedure: BIOPSY TEMPORAL ARTERY;  Surgeon: CAngelia Mould MD;  [facility]  Service: Vascular;  Laterality: Right;  . BUNIONECTOMY WITH HAMMERTOE RECONSTRUCTION Bilateral   . CATARACT EXTRACTION W/ INTRAOCULAR LENS IMPLANT Left 11/06/2014  . CESAREAN SECTION  1974  . CHOLECYSTECTOMY OPEN  ~ 1971  . CORONARY ANGIOPLASTY WITH STENT PLACEMENT  "lots"  . CORONARY ARTERY BYPASS GRAFT  2005   'CABG X4"  . DILATION AND CURETTAGE OF UTERUS  "several"  . EXCISIONAL HEMORRHOIDECTOMY  ~ 1966  . GANGLION CYST EXCISION Right ~ 1965  . LEFT HEART CATHETERIZATION WITH CORONARY/GRAFT ANGIOGRAM N/A 02/22/2011   Procedure: LEFT HEART CATHETERIZATION WITH CBeatrix Fetters  Surgeon: KPixie Casino MD;  [facility];  Service: Cardiovascular;  Laterality: N/A;  . NECK LESION BIOPSY Left 1980's  . PILONIDAL CYST EXCISION  ~ 1966  . TOE AMPUTATION Left 2008-2011   1st  and 2nd digits  . TONSILLECTOMY AND ADENOIDECTOMY  ~ 1961  . WISDOM TOOTH EXTRACTION  ~ 1972   "all at one time"    Social History   Socioeconomic History  . Marital status: Married    Spouse name: Not on file  . Number of children: 1  . Years of education: Not on file  . Highest education level: Not on file  Occupational History  . Not on file  Social Needs  . Financial resource strain: Not on file  . Food insecurity:    Worry: Not on file    Inability: Not on file  . Transportation needs:    Medical: Not on file    Non-medical: Not on file  Tobacco Use  . Smoking status: Never Smoker  . Smokeless tobacco: Never Used  Substance and Sexual Activity  .  Alcohol use: No    Alcohol/week: 0.0 standard drinks  . Drug use: No  . Sexual activity: Not Currently  Lifestyle  . Physical activity:    Days per week: Not on file    Minutes per session: Not on file  . Stress: Not on file  Relationships  . Social connections:    Talks on phone: Not on file    Gets together: Not on file    Attends religious service: Not on file    Active member of club or organization: Not on file    Attends meetings of clubs or organizations: Not on file    Relationship status: Not on file  . Intimate partner violence:    Fear of current or ex partner: Not on file    Emotionally abused: Not on file    Physically abused: Not on file    Forced sexual activity: Not on file  Other Topics Concern  . Not on file  Social History Narrative  . Not on file    Current Outpatient Medications on File Prior to Visit  Medication Sig Dispense Refill  . acetaminophen (TYLENOL) 500 MG tablet Take 1,000 mg by mouth every 8 (eight) hours as needed for moderate pain. For pain.    Marland Kitchen allopurinol (ZYLOPRIM) 100 MG tablet Mon, Wed, Fri    . amLODipine (NORVASC) 5 MG tablet Take 5 mg by mouth daily.    Marland Kitchen aspirin EC 81 MG tablet Take 81 mg by mouth every evening.     . betamethasone valerate (VALISONE) 0.1 % cream Apply 1 application topically daily as needed. For rash    . bimatoprost (LUMIGAN) 0.01 % SOLN Place 1 drop into both eyes at bedtime.      . bumetanide (BUMEX) 2 MG tablet Take 2 mg by mouth daily.      . Cholecalciferol (VITAMIN D-1000 MAX ST) 25 MCG (1000 UT) tablet 800 Units daily.    . Choline Fenofibrate (TRILIPIX) 45 MG capsule Take 45 mg by mouth every evening.     . cloNIDine (CATAPRES - DOSED IN MG/24 HR) 0.2 mg/24hr patch Place 0.2 mg onto the skin once a week. Every Saturday    . docusate sodium (COLACE) 100 MG capsule Take 100 mg by mouth daily as needed for mild constipation.     Marland Kitchen esomeprazole (NEXIUM) 40 MG capsule Take 40 mg by mouth daily before  breakfast.      . Icosapent Ethyl (VASCEPA) 1 G CAPS Take 1 capsule by mouth 3 (three) times daily.    . insulin detemir (LEVEMIR) 100 UNIT/ML injection Inject 80 Units into the skin 2 (two) times  daily.     . insulin lispro (HUMALOG) 100 UNIT/ML injection Inject 50-55 Units into the skin 3 (three) times daily as needed for high blood sugar. Sliding scale.    . isosorbide dinitrate (ISORDIL) 20 MG tablet Take 20 mg by mouth 2 (two) times daily.    Marland Kitchen loratadine (CLARITIN) 10 MG tablet Take 10 mg by mouth every evening.     . mometasone (NASONEX) 50 MCG/ACT nasal spray Place 2 sprays into the nose daily.      . nitroGLYCERIN (NITROSTAT) 0.4 MG SL tablet Place 0.4 mg under the tongue every 5 (five) minutes as needed. For pain.     . rosuvastatin (CRESTOR) 20 MG tablet Take 20 mg by mouth every evening.     . simethicone (MYLICON) 026 MG chewable tablet Chew 125 mg by mouth at bedtime.     . sotalol (BETAPACE) 80 MG tablet Take 80 mg by mouth 2 (two) times daily.      Marland Kitchen spironolactone (ALDACTONE) 25 MG tablet Take 1 tablet (25 mg total) by mouth 2 (two) times daily. 180 tablet 3  . Timolol Maleate (ISTALOL) 0.5 % (DAILY) SOLN Apply 1 drop to eye every morning. Both eyes     No current facility-administered medications on file prior to visit.     Review of Systems  Constitution: Negative for chills, decreased appetite, malaise/fatigue and weight gain.  Cardiovascular: Positive for chest pain, dyspnea on exertion and palpitations. Negative for leg swelling, paroxysmal nocturnal dyspnea and syncope.  Endocrine: Negative for cold intolerance.  Hematologic/Lymphatic: Does not bruise/bleed easily.  Musculoskeletal: Positive for back pain, joint pain and muscle weakness. Negative for joint swelling.  Gastrointestinal: Negative for abdominal pain, anorexia and change in bowel habit.  Neurological: Positive for numbness (both feet), paresthesias and sensory change (both legs chronic). Negative for  headaches and light-headedness.  Psychiatric/Behavioral: Negative for depression and substance abuse. The patient is nervous/anxious.   All other systems reviewed and are negative.     Objective:  Blood pressure 138/62, pulse 60, height '5\' 6"'  (1.676 m), weight 192 lb (87.1 kg). Body mass index is 30.99 kg/m. Not performed due to telephone only encounter Physical Exam Radiology: No results found. Laboratory Examination:   01/03/2018: Normal H&H, MCH 26.9, RDW 17.4%, platelets 121, CBC otherwise normal.  Potassium 4.4, creatinine 1.4, EGFR 36/42, CMP otherwise normal.  Cholesterol 109, triglycerides 191, HDL 33, LDL 52.  Vitamin D 9.  CMP Latest Ref Rng & Units 02/01/2015 02/01/2015 01/31/2015  Glucose 65 - 99 mg/dL 353(H) 262(H) 222(H)  BUN 6 - 20 mg/dL 54(H) 52(H) 45(H)  Creatinine 0.44 - 1.00 mg/dL 1.81(H) 1.78(H) 1.76(H)  Sodium 135 - 145 mmol/L 136 136 138  Potassium 3.5 - 5.1 mmol/L 5.1 5.7(H) 3.7  Chloride 101 - 111 mmol/L 102 102 100(L)  CO2 22 - 32 mmol/L '24 24 28  ' Calcium 8.9 - 10.3 mg/dL 9.3 9.2 9.5  Total Protein 6.5 - 8.1 g/dL - - -  Total Bilirubin 0.3 - 1.2 mg/dL - - -  Alkaline Phos 38 - 126 U/L - - -  AST 15 - 41 U/L - - -  ALT 14 - 54 U/L - - -   CBC Latest Ref Rng & Units 01/31/2015 01/29/2015 01/28/2015  WBC 4.0 - 10.5 K/uL 9.2 6.6 5.8  Hemoglobin 12.0 - 15.0 g/dL 11.0(L) 11.7(L) 12.1  Hematocrit 36.0 - 46.0 % 35.8(L) 37.6 39.2  Platelets 150 - 400 K/uL 129(L) 127(L) 121(L)   Lipid Panel  Component Value Date/Time   CHOL 104 01/28/2015 2033   TRIG 148 01/28/2015 2033   HDL 32 (L) 01/28/2015 2033   CHOLHDL 3.3 01/28/2015 2033   VLDL 30 01/28/2015 2033   LDLCALC 42 01/28/2015 2033   HEMOGLOBIN A1C Lab Results  Component Value Date   HGBA1C 6.7 (H) 01/28/2015   MPG 146 01/28/2015   TSH No results for input(s): TSH in the last 8760 hours.  Cardiac studies:   CABG 2005 by Dr. Roxan Hockey. Heart cath 02/2011 Occluded LAD, Cx  S/P CABG 2005 LIMA  to LAD, SVG to OM-1 and OM-2, SVG to PDA.   Echocardiogram 04/23/2013: 1. Left ventricular cavity is normal in size.   Mild concentric hypertrophy.   Normal global wall motion.   Normal systolic global function.   Calculated EF 61%.  Unable to evaluate the diastolic function due to frequent PAC. 2. Left atrial cavity is moderately dilated. mild right atrial enlargement. 3. Mitral valve structurally normal.   Mild mitral regurgitation.   Abnormal mitral valve inflow. 4. Tricuspid valve structurally normal.   Mild tricuspid regurgitation.   Moderate pulmonary hypertension.    5. Compared to the study done on 03/08/12, moderate pulmonary hypertension new.  Lexiscan Myoview stress test 03/19/2013: 1. Resting EKG NSR, Poor R wave progression. LAD, LAFB, Non specific ST change. Stress EKG was non diagnostic for ischemia. No ST-T changes of ischemia noted with pharmacologic stress testing. Stress symptoms included DYSPNEA, light headedness. Stress terminated due to completion of protocol. 2. The perfusion study demonstrated a moderate sized inferior wall scar with no significant peri-infarct ischemia.  There is no statistical significant difference between rest and stress polar plot images.. Left ventricular ejection fraction was estimated to be 69%.  This represents a low risk study.   Assessment:    Coronary artery disease of native artery of native heart with stable angina pectoris ([place]) Occluded LAD, Cx  S/P CABG 2005 LIMA to LAD, SVG to OM-1 and O - Plan: PCV MYOCARDIAL PERFUSION WITH LEXISCAN  Chronic diastolic CHF (congestive heart failure) ([place]) - Plan: PCV ECHOCARDIOGRAM COMPLETE  Essential hypertension - Plan: PCV MYOCARDIAL PERFUSION WITH LEXISCAN  Hypercholesteremia  Diabetes mellitus due to underlying condition with stage 3 chronic kidney disease, with long-term current use of insulin (H[facility])  Pain in both lower extremities - Plan: PCV ANKLE BRACHIAL INDEX (ABI)  EKG 01/06/2018: Normal  sinus rhythm at rate of 69 bpm, left axis deviation, left anterior fascicular block.  Poor R-wave progression, probably normal variant.  Normal QT interval.  LVH with repolarization abnormality. No significant change from EKG 12/18/2015  Recommendations:    Patient with known coronary artery disease, multiple cardiovascular risk factors including uncontrolled diabetes, hypertension now having frequent episodes of chest pain suggestive of angina pectoris relieved with nitroglycerin.  She has been using one to 2 sublingual nitroglycerin on a daily basis.  Schedule for a Lexiscan Sestamibi stress test to evaluate for myocardial ischemia. Patient unable to do treadmill stress testing due to dyspnea and arthritis, Will schedule for an echocardiogram. Check ABI for leg pain, suspect pain may be related to diabetic peripheral neuropathy however she had also developed a also in the callus right foot which is fortunately healed in February 2020.  I would like to see her back in the office after the tests in the next 2-3 weeks, she is advised to contact me immediately if she starts having any rest pain or if the chest pain is not relieved with sublingual nitroglycerin, also  20 to go to the emergency room.  Otherwise she is on appropriate medical therapy including beta blockers, nitrates and calcium channel blockers along with statins.  She is also on aspirin.  Adrian Prows, MD, [facility] 07/12/2018, [place]. [place] Pager: [phone] Office: [phone] If no answer Cell [phone]

## 2018-07-13 ENCOUNTER — Telehealth: Payer: Self-pay

## 2018-07-13 ENCOUNTER — Other Ambulatory Visit: Payer: Self-pay | Admitting: Cardiology

## 2018-07-13 MED ORDER — AMLODIPINE BESYLATE 10 MG PO TABS: 10.0000 mg | ORAL_TABLET | Freq: Every day | ORAL | 1 refills | Status: AC

## 2018-07-13 NOTE — Telephone Encounter (Signed)
Pt called requesting rf of amlodipine sent to pharmacy due to increased dose to 10mg . I do not see this reflected in ov note yesterday. Please clarify if correct so I can send in to pharmacy.//ah

## 2018-07-19 ENCOUNTER — Other Ambulatory Visit: Payer: Self-pay

## 2018-07-19 NOTE — Telephone Encounter (Signed)
10mg  sent to [facility]

## 2018-07-24 ENCOUNTER — Other Ambulatory Visit: Payer: Self-pay

## 2018-07-24 ENCOUNTER — Ambulatory Visit (INDEPENDENT_AMBULATORY_CARE_PROVIDER_SITE_OTHER): Payer: Medicare Other

## 2018-07-24 DIAGNOSIS — I10 Essential (primary) hypertension: Secondary | ICD-10-CM

## 2018-07-24 DIAGNOSIS — I25118 Atherosclerotic heart disease of native coronary artery with other forms of angina pectoris: Secondary | ICD-10-CM | POA: Diagnosis not present

## 2018-07-26 NOTE — Progress Notes (Signed)
S/w pt advised her of normal stress test

## 2018-08-16 ENCOUNTER — Other Ambulatory Visit: Payer: Medicare Other

## 2018-08-23 ENCOUNTER — Ambulatory Visit: Payer: Medicare Other | Admitting: Cardiology

## 2018-09-07 ENCOUNTER — Other Ambulatory Visit: Payer: Self-pay

## 2018-09-07 MED ORDER — SOTALOL HCL 80 MG PO TABS: 80.0000 mg | ORAL_TABLET | Freq: Two times a day (BID) | ORAL | 0 refills | Status: AC

## 2018-09-07 NOTE — Telephone Encounter (Signed)
Is this ok to send?  

## 2018-09-18 ENCOUNTER — Other Ambulatory Visit: Payer: Self-pay

## 2018-09-18 ENCOUNTER — Ambulatory Visit: Payer: Medicare Other

## 2018-09-18 ENCOUNTER — Ambulatory Visit (INDEPENDENT_AMBULATORY_CARE_PROVIDER_SITE_OTHER): Payer: Medicare Other

## 2018-09-18 DIAGNOSIS — I5032 Chronic diastolic (congestive) heart failure: Secondary | ICD-10-CM

## 2018-09-18 DIAGNOSIS — M79605 Pain in left leg: Secondary | ICD-10-CM

## 2018-09-18 DIAGNOSIS — M79604 Pain in right leg: Secondary | ICD-10-CM | POA: Diagnosis not present

## 2018-09-20 NOTE — Progress Notes (Signed)
Call patient

## 2018-09-20 NOTE — Progress Notes (Signed)
Lmom with details

## 2018-09-26 ENCOUNTER — Ambulatory Visit: Payer: Medicare Other | Admitting: Cardiology

## 2018-10-09 ENCOUNTER — Ambulatory Visit: Payer: Medicare Other | Admitting: Cardiology

## 2018-10-12 ENCOUNTER — Other Ambulatory Visit: Payer: Self-pay

## 2018-10-12 ENCOUNTER — Encounter: Payer: Self-pay | Admitting: Cardiology

## 2018-10-12 ENCOUNTER — Ambulatory Visit (INDEPENDENT_AMBULATORY_CARE_PROVIDER_SITE_OTHER): Payer: Medicare Other | Admitting: Cardiology

## 2018-10-12 VITALS — BP 156/59 | HR 68 | Ht 66.0 in | Wt 214.0 lb

## 2018-10-12 DIAGNOSIS — M79605 Pain in left leg: Secondary | ICD-10-CM

## 2018-10-12 DIAGNOSIS — I25118 Atherosclerotic heart disease of native coronary artery with other forms of angina pectoris: Secondary | ICD-10-CM

## 2018-10-12 DIAGNOSIS — E0822 Diabetes mellitus due to underlying condition with diabetic chronic kidney disease: Secondary | ICD-10-CM

## 2018-10-12 DIAGNOSIS — I129 Hypertensive chronic kidney disease with stage 1 through stage 4 chronic kidney disease, or unspecified chronic kidney disease: Secondary | ICD-10-CM | POA: Diagnosis not present

## 2018-10-12 DIAGNOSIS — Z794 Long term (current) use of insulin: Secondary | ICD-10-CM

## 2018-10-12 DIAGNOSIS — I5032 Chronic diastolic (congestive) heart failure: Secondary | ICD-10-CM | POA: Diagnosis not present

## 2018-10-12 DIAGNOSIS — I10 Essential (primary) hypertension: Secondary | ICD-10-CM

## 2018-10-12 DIAGNOSIS — N183 Chronic kidney disease, stage 3 (moderate): Secondary | ICD-10-CM

## 2018-10-12 DIAGNOSIS — M79604 Pain in right leg: Secondary | ICD-10-CM

## 2018-10-12 DIAGNOSIS — R600 Localized edema: Secondary | ICD-10-CM

## 2018-10-12 MED ORDER — FUROSEMIDE 40 MG PO TABS: 40.0000 mg | ORAL_TABLET | Freq: Every day | ORAL | 2 refills | Status: AC | PRN

## 2018-10-12 MED ORDER — TORSEMIDE 20 MG PO TABS: 20.0000 mg | ORAL_TABLET | Freq: Every day | ORAL | 2 refills | Status: AC | PRN

## 2018-10-12 NOTE — Progress Notes (Signed)
Primary Physician/Referring:  Verdell Carmine., MD  Patient ID: Brenda [name], female    DOB: [birth year], 73 y.o.   MRN: [id number]  Chief Complaint  Patient presents with  . Coronary Artery Disease  . Congestive Heart Failure  . Leg Swelling    HPI: Brenda [name]  is a 73 y.o. female  with CAD and CABG x 3 in 2005, patent grafts in 2012 except for occluded SVG to RCA, however the RCA had minimal disease. She had remote atrial fibrillation in 2005 and has been on sotalol without recurrence. 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 She also has chronic leg edema and uses support stockings regularly.   In Feb 2020 had developed diabetic foot ulcer in the right foot at the site of chronic callus and this has healed.   I had recently seen about 4 weeks ago with exertional chest pain, worsening dyspnea and leg edema.  Recommended further evaluation including stress testing and echocardiogram and presents for follow-up.  She also states that she has developed ulceration near her vulva and is being treated with antibiotics for the same and is taking a long time to heal.  Diabetes has been uncontrolled. Has had occasional chest tightness but has not used NTG.   Past Medical History:  Diagnosis Date  . A-fib ([place])   . Angina at rest [facility])   . Arthritis    "back, knees, feet" (01/30/2015)  . CAD (coronary artery disease) 02/20/2011  . CHF (congestive heart failure) ([place])   . CHRONIC KIDNEY DISEASE STAGE III (MODERATE) 05/12/2009  . Chronic low back pain    "into hips"  . Cirrhosis ([place])   . Diabetic peripheral neuropathy ([place])   . DVT (deep venous thrombosis) ([place]) ~ 2002   RLE  . Essential hypertension 07/12/2018  . Fibromyalgia   . GERD (gastroesophageal reflux disease)   . Glaucoma, both eyes   . Gout   . Headache    "~ q week" (01/30/2015)  .  History of blood transfusion 2005   "related to bypass OR"  . Hyperlipemia   . Hypertension   . Jaundice ~ 1952   "yellow jaundice"  . NAFLD (nonalcoholic fatty liver disease) 02/20/2011  . Renal disorder   . Sleep apnea   . Thrombocytopenia ([place]) 02/21/2011  . Type II diabetes mellitus ([place])   . Ulcer of lower limb, unspecified 05/12/2009    Past Surgical History:  Procedure Laterality Date  . ABDOMINAL HYSTERECTOMY  1980  . ANAL FISSURE REPAIR  ~ 1966   "w/fistula"  . APPENDECTOMY  ~ 1971  . ARTERY BIOPSY Right 01/31/2015   Procedure: BIOPSY TEMPORAL ARTERY;  Surgeon: Angelia Mould, MD;  Location: [facility];  Service: Vascular;  Laterality: Right;  . BUNIONECTOMY WITH HAMMERTOE RECONSTRUCTION Bilateral   . CATARACT EXTRACTION W/ INTRAOCULAR LENS IMPLANT Left 11/06/2014  . CESAREAN SECTION  1974  . CHOLECYSTECTOMY OPEN  ~ 1971  . CORONARY ANGIOPLASTY WITH STENT PLACEMENT  "lots"  . CORONARY ARTERY BYPASS GRAFT  2005   'CABG X4"  . DILATION AND CURETTAGE OF UTERUS  "several"  . EXCISIONAL HEMORRHOIDECTOMY  ~ 1966  . GANGLION CYST EXCISION Right ~ 1965  . LEFT HEART CATHETERIZATION WITH CORONARY/GRAFT ANGIOGRAM N/A 02/22/2011   Procedure: LEFT HEART CATHETERIZATION WITH Beatrix Fetters;  Surgeon: Pixie Casino, MD;  Location: [facility] CATH LAB;  Service: Cardiovascular;  Laterality: N/A;  . NECK LESION BIOPSY Left 1980's  . PILONIDAL CYST EXCISION  ~ 1966  . TOE AMPUTATION Left 2008-2011   1st and 2nd digits  . TONSILLECTOMY AND ADENOIDECTOMY  ~ 1961  . WISDOM TOOTH EXTRACTION  ~ 1972   "all at one time"    Social History   Socioeconomic History  . Marital status: Married    Spouse name: Not on file  . Number of children: 1  . Years of education: Not on file  . Highest education level: Not on file  Occupational History  . Not on file  Social Needs  . Financial resource strain: Not on file  . Food insecurity    Worry: Not on file    Inability: Not on file   . Transportation needs    Medical: Not on file    Non-medical: Not on file  Tobacco Use  . Smoking status: Never Smoker  . Smokeless tobacco: Never Used  Substance and Sexual Activity  . Alcohol use: No    Alcohol/week: 0.0 standard drinks  . Drug use: No  . Sexual activity: Not Currently  Lifestyle  . Physical activity    Days per week: Not on file    Minutes per session: Not on file  . Stress: Not on file  Relationships  . Social Herbalist on phone: Not on file    Gets together: Not on file    Attends religious service: Not on file    Active member of club or organization: Not on file    Attends meetings of clubs or organizations: Not on file    Relationship status: Not on file  . Intimate partner violence    Fear of current or ex partner: Not on file    Emotionally abused: Not on file    Physically abused: Not on file    Forced sexual activity: Not on file  Other Topics Concern  . Not on file  Social History Narrative  . Not on file    Current Outpatient Medications on File Prior to Visit  Medication Sig Dispense Refill  . acetaminophen (TYLENOL) 500 MG tablet Take 1,000 mg by mouth every 8 (eight) hours as needed for moderate pain. For pain.    Marland Kitchen allopurinol (ZYLOPRIM) 100 MG tablet Mon, Wed, Fri    . amLODipine (NORVASC) 10 MG tablet Take 1 tablet (10 mg total) by mouth daily. (Patient taking differently: Take 5 mg by mouth daily. ) 90 tablet 1  . aspirin EC 81 MG tablet Take 81 mg by mouth every evening.     . betamethasone valerate (VALISONE) 0.1 % cream Apply 1 application topically daily as needed. For rash    . bimatoprost (LUMIGAN) 0.01 % SOLN Place 1 drop into both eyes at bedtime.      . bumetanide (BUMEX) 2 MG tablet Take 2 mg by mouth daily.      Marland Kitchen BYETTA 5 MCG PEN 5 MCG/0.02ML SOPN injection     . Cholecalciferol (VITAMIN D-1000 MAX ST) 25 MCG (1000 UT) tablet 800 Units daily.    . Choline Fenofibrate (TRILIPIX) 45 MG capsule Take 45 mg by  mouth every evening.     . cloNIDine (CATAPRES - DOSED IN MG/24 HR) 0.2 mg/24hr patch Place 0.2 mg onto the skin once a week. Every Saturday    . docusate sodium (COLACE) 100 MG capsule Take 100 mg by mouth daily as needed for mild constipation.     Marland Kitchen  esomeprazole (NEXIUM) 40 MG capsule Take 40 mg by mouth daily before breakfast.      . Icosapent Ethyl (VASCEPA) 1 G CAPS Take 1 capsule by mouth 3 (three) times daily.    . insulin detemir (LEVEMIR) 100 UNIT/ML injection Inject 80 Units into the skin 2 (two) times daily.     . insulin lispro (HUMALOG) 100 UNIT/ML injection Inject 50-55 Units into the skin 3 (three) times daily as needed for high blood sugar. Sliding scale.    . isosorbide dinitrate (ISORDIL) 20 MG tablet Take 20 mg by mouth 2 (two) times daily.    Marland Kitchen loratadine (CLARITIN) 10 MG tablet Take 10 mg by mouth every evening.     . mometasone (NASONEX) 50 MCG/ACT nasal spray Place 2 sprays into the nose daily.      . nitroGLYCERIN (NITROSTAT) 0.4 MG SL tablet Place 0.4 mg under the tongue every 5 (five) minutes as needed. For pain.     . rosuvastatin (CRESTOR) 20 MG tablet Take 20 mg by mouth every evening.     . simethicone (MYLICON) 026 MG chewable tablet Chew 125 mg by mouth at bedtime.     . sotalol (BETAPACE) 80 MG tablet Take 1 tablet (80 mg total) by mouth 2 (two) times daily. 90 tablet 0  . spironolactone (ALDACTONE) 25 MG tablet Take 1 tablet (25 mg total) by mouth 2 (two) times daily. 180 tablet 3  . Timolol Maleate (ISTALOL) 0.5 % (DAILY) SOLN Apply 1 drop to eye every morning. Both eyes     No current facility-administered medications on file prior to visit.     Review of Systems  Constitution: Negative for chills, decreased appetite, malaise/fatigue and weight gain.  Cardiovascular: Positive for chest pain, dyspnea on exertion, leg swelling and palpitations. Negative for paroxysmal nocturnal dyspnea and syncope.  Endocrine: Negative for cold intolerance.   Hematologic/Lymphatic: Does not bruise/bleed easily.  Musculoskeletal: Positive for back pain, joint pain and muscle weakness. Negative for joint swelling.  Gastrointestinal: Negative for abdominal pain, anorexia and change in bowel habit.  Neurological: Positive for numbness (both feet), paresthesias and sensory change (both legs chronic). Negative for headaches and light-headedness.  Psychiatric/Behavioral: Negative for depression and substance abuse. The patient is nervous/anxious.   All other systems reviewed and are negative.     Objective:  Blood pressure (!) 156/59, pulse 68, height 5' 6" (1.676 m), weight 214 lb (97.1 kg), SpO2 97 %. Body mass index is 34.54 kg/m. Not performed due to telephone only encounter Physical Exam  Constitutional: She appears well-developed and well-nourished. No distress.  HENT:  Head: Atraumatic.  Eyes: Conjunctivae are normal.  Neck: Neck supple. No JVD present. No thyromegaly present.  Cardiovascular: Normal rate, regular rhythm and normal heart sounds. Exam reveals no gallop.  No murmur heard. 2+ pitting bilateral leg edema. No carotid bruit. Femoral pulse normal, Popliteal pulse difficult to feel and pedal pulse is faint bilateral. No ulceration.   Pulmonary/Chest: Effort normal and breath sounds normal.  Abdominal: Soft. Bowel sounds are normal.  Musculoskeletal: Normal range of motion.  Neurological: She is alert.  Skin: Skin is warm and dry.  Psychiatric: She has a normal mood and affect.   Radiology: No results found. Laboratory Examination:   Labs 09/28/2018: 8.1%.    08/02/2018: BUN 30, serum glucose 171 mg, creatinine 1.35, eGFR 39 mL.  CMP otherwise normal.  Total cholesterol 110, triglycerides 160, HDL 39, LDL 48.  TSH normal.  01/03/2018: Normal H&H, MCH 26.9, RDW 17.4%,  platelets 121, CBC otherwise normal.  Potassium 4.4, creatinine 1.4, EGFR 36/42, CMP otherwise normal.  Cholesterol 109, triglycerides 191, HDL 33, LDL 52.   Vitamin D 9.  CMP Latest Ref Rng & Units 02/01/2015 02/01/2015 01/31/2015  Glucose 65 - 99 mg/dL 353(H) 262(H) 222(H)  BUN 6 - 20 mg/dL 54(H) 52(H) 45(H)  Creatinine 0.44 - 1.00 mg/dL 1.81(H) 1.78(H) 1.76(H)  Sodium 135 - 145 mmol/L 136 136 138  Potassium 3.5 - 5.1 mmol/L 5.1 5.7(H) 3.7  Chloride 101 - 111 mmol/L 102 102 100(L)  CO2 22 - 32 mmol/L _0 Calcium 8.9 - 10.3 mg/dL 9.3 9.2 9.5  Total Protein 6.5 - 8.1 g/dL - - -  Total Bilirubin 0.3 - 1.2 mg/dL - - -  Alkaline Phos 38 - 126 U/L - - -  AST 15 - 41 U/L - - -  ALT 14 - 54 U/L - - -   CBC Latest Ref Rng & Units 01/31/2015 01/29/2015 01/28/2015  WBC 4.0 - 10.5 K/uL 9.2 6.6 5.8  Hemoglobin 12.0 - 15.0 g/dL 11.0(L) 11.7(L) 12.1  Hematocrit 36.0 - 46.0 % 35.8(L) 37.6 39.2  Platelets 150 - 400 K/uL 129(L) 127(L) 121(L)   Lipid Panel     Component Value Date/Time   CHOL 104 01/28/2015 2033   TRIG 148 01/28/2015 2033   HDL 32 (L) 01/28/2015 2033   CHOLHDL 3.3 01/28/2015 2033   VLDL 30 01/28/2015 2033   LDLCALC 42 01/28/2015 2033   HEMOGLOBIN A1C Lab Results  Component Value Date   HGBA1C 6.7 (H) 01/28/2015   MPG 146 01/28/2015   TSH No results for input(s): TSH in the last 8760 hours.  Cardiac studies:   CABG 2005 by Dr. Roxan Hockey. Heart cath 02/2011 Occluded LAD, Cx  S/P CABG 2005 LIMA to LAD, SVG to OM-1 and OM-2, SVG to PDA.   Echocardiogram 09/18/2018 :  Normal LV systolic function with EF 61%. Left ventricle cavity is normal in size. Mild concentric hypertrophy of the left ventricle. Normal global wall motion. Doppler evidence of grade II (pseudonormal) diastolic dysfunction. Diastolic dysfunction findings suggests elevated LA/LV end diastolic pressure. Calculated EF 61%. Compared to the study done on 10/22/2013, previously grade 3 diastolic dysfunction was noted, patient also had moderate pulmonary hypertension.  PA systolic pressure not able to be estimated due to trace TR.  ABI 09/18/2018: This exam  reveals mildly decreased perfusion of the right lower extremity, noted at the post tibial artery level (ABI 0.94) with biphasic waveform at the right ankle. This exam reveals normal perfusion of the left lower extremity (ABI 0.97) with normal triphasic waveform at the left ankle.   Lexiscan Myoview stress test 07/25/2018: 1. Lexiscan stress test was performed. Exercise capacity was not assessed. Stress symptoms included dyspnea, dizziness. Resting blood pressure was 166/80 mmHg and peak effect blood pressure was 144/64 mmHg. Stress EKG is non diagnostic for ischemia as it is a pharmacologic stress.  2. The overall quality of the study is good. There is no evidence of abnormal lung activity. Stress and rest SPECT images demonstrate homogeneous tracer distribution throughout the myocardium. Gated SPECT imaging reveals normal myocardial thickening and wall motion. The left ventricular ejection fraction was normal (68%).   3. Low risk study. `  Assessment:      ICD-10-CM   1. Coronary artery disease of native artery of native heart with stable angina pectoris ([place])  I25.118 EKG 12-Lead  2. Chronic diastolic CHF (congestive heart failure) ([facility])  I50.32 EKG  12-Lead    furosemide (LASIX) 40 MG tablet    torsemide (DEMADEX) 20 MG tablet  3. Essential hypertension  I10   4. Diabetes mellitus due to underlying condition with stage 3 chronic kidney disease, with long-term current use of insulin ([facility])  E08.22    N18.3    Z79.4   5. Pain in both lower extremities  M79.604    M79.605   6. Bilateral leg edema  R60.0 furosemide (LASIX) 40 MG tablet    torsemide (DEMADEX) 20 MG tablet     EKG 10/12/2018: Normal sinus rhythm at rate of 72 bpm, left atrial abnormality left axis deviation, left anterior fascicular block.  LVH with repolarization abnormality.  Cannot exclude high lateral ischemia.  Nonspecific T abnormality. No significant change from  EKG 01/06/2018.  Recommendations:    Patient here for f/u  of Chest pain suggestive of angina pectoris, bilateral leg pain suggestive of claudication, hypertension and hyperlipidemia. Reassured her with her chest pain, low risk stress, continue medical therapy.   I reviewed the results of the stress test and reassured her.  Suspect chest pain could be related to microvascular angina or small vessel disease and can be treated medically.  I also reviewed the echocardiogram.  She does have diastolic dysfunction.  Her shortness of breath is multifactorial in etiology including obesity, uncontrolled hypertension, diabetes with diabetic complications and decreased exercise tolerance from lack of mobility from severe DJD and chronic back pain.  I reviewed her MRI of the back and x-ray of the hips done at [facility] which reveals disc prolapse, spinal stenosis in the lumbar region, severe bilateral DJD of hips.  Elevated blood pressure is also probably due to pain.  For leg edema, which is probably from diastolic dysfunction and diastolic heart failure plus immobility will try torsemide to take on a p.r.n. basis.  I'll see her back in 6 months.  Adrian Prows, MD, [facility] 10/12/2018, 11:41 AM [facility] [place] Pager: [phone] Office: [phone] If no answer Cell [phone]

## 2019-04-16 ENCOUNTER — Ambulatory Visit: Payer: Medicare Other | Admitting: Cardiology

## 2019-04-18 ENCOUNTER — Other Ambulatory Visit: Payer: Self-pay | Admitting: Cardiology

## 2019-04-18 DIAGNOSIS — I5031 Acute diastolic (congestive) heart failure: Secondary | ICD-10-CM

## 2021-12-30 ENCOUNTER — Encounter: Payer: Self-pay | Admitting: Cardiology
